# Patient Record
Sex: Male | Born: 1950 | Race: Black or African American | Hispanic: No | Smoking: Former smoker
Health system: Southern US, Community
[De-identification: ages and names within clinical notes are randomized; demographics above are authoritative.]

## PROBLEM LIST (undated history)

## (undated) DIAGNOSIS — Z860100 Personal history of colon polyps, unspecified: Secondary | ICD-10-CM

## (undated) DIAGNOSIS — E119 Type 2 diabetes mellitus without complications: Secondary | ICD-10-CM

## (undated) DIAGNOSIS — R3129 Other microscopic hematuria: Secondary | ICD-10-CM

## (undated) DIAGNOSIS — N400 Enlarged prostate without lower urinary tract symptoms: Secondary | ICD-10-CM

## (undated) HISTORY — DX: Personal history of colon polyps, unspecified: Z86.0100

## (undated) HISTORY — DX: Personal history of colonic polyps: Z86.010

## (undated) HISTORY — DX: Benign prostatic hyperplasia without lower urinary tract symptoms: N40.0

## (undated) HISTORY — PX: CHOLECYSTECTOMY: SHX55

## (undated) HISTORY — PX: COLONOSCOPY W/ POLYPECTOMY: SHX1380

## (undated) HISTORY — DX: Other microscopic hematuria: R31.29

## (undated) HISTORY — DX: Type 2 diabetes mellitus without complications: E11.9

---

## 2001-10-21 ENCOUNTER — Encounter: Payer: Self-pay | Admitting: Emergency Medicine

## 2001-10-21 ENCOUNTER — Emergency Department (HOSPITAL_COMMUNITY): Admission: EM | Admit: 2001-10-21 | Discharge: 2001-10-21 | Payer: Self-pay | Admitting: Emergency Medicine

## 2002-10-07 DIAGNOSIS — Z86010 Personal history of colonic polyps: Secondary | ICD-10-CM

## 2002-10-07 DIAGNOSIS — Z860101 Personal history of adenomatous and serrated colon polyps: Secondary | ICD-10-CM | POA: Insufficient documentation

## 2002-10-07 HISTORY — DX: Personal history of colonic polyps: Z86.010

## 2002-11-04 ENCOUNTER — Encounter: Payer: Self-pay | Admitting: Emergency Medicine

## 2002-11-04 ENCOUNTER — Emergency Department (HOSPITAL_COMMUNITY): Admission: EM | Admit: 2002-11-04 | Discharge: 2002-11-04 | Payer: Self-pay | Admitting: Emergency Medicine

## 2003-07-05 ENCOUNTER — Emergency Department (HOSPITAL_COMMUNITY): Admission: EM | Admit: 2003-07-05 | Discharge: 2003-07-05 | Payer: Self-pay | Admitting: Emergency Medicine

## 2003-07-09 ENCOUNTER — Encounter (INDEPENDENT_AMBULATORY_CARE_PROVIDER_SITE_OTHER): Payer: Self-pay | Admitting: Specialist

## 2003-07-09 ENCOUNTER — Encounter: Payer: Self-pay | Admitting: Surgery

## 2003-07-10 ENCOUNTER — Inpatient Hospital Stay (HOSPITAL_COMMUNITY): Admission: RE | Admit: 2003-07-10 | Discharge: 2003-07-11 | Payer: Self-pay | Admitting: Surgery

## 2004-10-17 ENCOUNTER — Ambulatory Visit: Payer: Self-pay | Admitting: Internal Medicine

## 2004-11-14 ENCOUNTER — Ambulatory Visit (HOSPITAL_COMMUNITY): Admission: RE | Admit: 2004-11-14 | Discharge: 2004-11-14 | Payer: Self-pay | Admitting: Internal Medicine

## 2005-03-06 ENCOUNTER — Ambulatory Visit: Payer: Self-pay | Admitting: Internal Medicine

## 2006-01-28 ENCOUNTER — Ambulatory Visit: Payer: Self-pay | Admitting: Internal Medicine

## 2006-04-02 ENCOUNTER — Ambulatory Visit: Payer: Self-pay | Admitting: Internal Medicine

## 2006-07-28 ENCOUNTER — Ambulatory Visit: Payer: Self-pay | Admitting: Internal Medicine

## 2007-01-27 ENCOUNTER — Ambulatory Visit: Payer: Self-pay | Admitting: Internal Medicine

## 2007-01-27 LAB — CONVERTED CEMR LAB
ALT: 21 units/L (ref 0–40)
AST: 18 units/L (ref 0–37)
Albumin: 3.8 g/dL (ref 3.5–5.2)
Alkaline Phosphatase: 47 units/L (ref 39–117)
BUN: 8 mg/dL (ref 6–23)
Bacteria, UA: NEGATIVE
Basophils Absolute: 0 10*3/uL (ref 0.0–0.1)
Basophils Relative: 0.8 % (ref 0.0–1.0)
Bilirubin Urine: NEGATIVE
Bilirubin, Direct: 0.1 mg/dL (ref 0.0–0.3)
CO2: 26 meq/L (ref 19–32)
Calcium: 9 mg/dL (ref 8.4–10.5)
Chloride: 105 meq/L (ref 96–112)
Cholesterol: 195 mg/dL (ref 0–200)
Creatinine, Ser: 0.8 mg/dL (ref 0.4–1.5)
Eosinophils Absolute: 0.2 10*3/uL (ref 0.0–0.6)
Eosinophils Relative: 3.9 % (ref 0.0–5.0)
Free T4: 0.5 ng/dL — ABNORMAL LOW (ref 0.6–1.6)
GFR calc Af Amer: 129 mL/min
GFR calc non Af Amer: 107 mL/min
Glucose, Bld: 119 mg/dL — ABNORMAL HIGH (ref 70–99)
HCT: 47.7 % (ref 39.0–52.0)
HDL: 49.3 mg/dL (ref >39.0)
Hemoglobin, Urine: NEGATIVE
Hemoglobin: 16.2 g/dL (ref 13.0–17.0)
Ketones, ur: NEGATIVE mg/dL
LDL Cholesterol: 126 mg/dL — ABNORMAL HIGH (ref 0–99)
Lymphocytes Relative: 32.5 % (ref 12.0–46.0)
MCHC: 34 g/dL (ref 30.0–36.0)
MCV: 93.6 fL (ref 78.0–100.0)
Monocytes Absolute: 0.5 10*3/uL (ref 0.2–0.7)
Monocytes Relative: 8.9 % (ref 3.0–11.0)
Mucus, UA: NEGATIVE
Neutro Abs: 3.4 10*3/uL (ref 1.4–7.7)
Neutrophils Relative %: 53.9 % (ref 43.0–77.0)
Nitrite: NEGATIVE
PSA: 1.02 ng/mL (ref 0.10–4.00)
Platelets: 202 10*3/uL (ref 150–400)
Potassium: 3.9 meq/L (ref 3.5–5.1)
RBC / HPF: NONE SEEN
RBC: 5.09 M/uL (ref 4.22–5.81)
RDW: 13.1 % (ref 11.5–14.6)
Sodium: 139 meq/L (ref 135–145)
Specific Gravity, Urine: 1.025 (ref 1.000–1.03)
TSH: 0.61 microintl units/mL (ref 0.35–5.50)
Total Bilirubin: 0.7 mg/dL (ref 0.3–1.2)
Total CHOL/HDL Ratio: 4
Total Protein, Urine: NEGATIVE mg/dL
Total Protein: 7.3 g/dL (ref 6.0–8.3)
Triglycerides: 97 mg/dL (ref 0–149)
Urine Glucose: NEGATIVE mg/dL
Urobilinogen, UA: 0.2 (ref 0.0–1.0)
VLDL: 19 mg/dL (ref 0–40)
WBC: 6.1 10*3/uL (ref 4.5–10.5)
pH: 6 (ref 5.0–8.0)

## 2007-02-03 ENCOUNTER — Ambulatory Visit: Payer: Self-pay | Admitting: Internal Medicine

## 2008-02-01 ENCOUNTER — Ambulatory Visit: Payer: Self-pay | Admitting: Internal Medicine

## 2008-02-01 DIAGNOSIS — R7309 Other abnormal glucose: Secondary | ICD-10-CM

## 2008-02-01 LAB — CONVERTED CEMR LAB
ALT: 29 units/L (ref 0–53)
AST: 21 units/L (ref 0–37)
Albumin: 4.1 g/dL (ref 3.5–5.2)
Alkaline Phosphatase: 47 units/L (ref 39–117)
BUN: 12 mg/dL (ref 6–23)
Basophils Absolute: 0 10*3/uL (ref 0.0–0.1)
Basophils Relative: 0.4 % (ref 0.0–1.0)
Bilirubin Urine: NEGATIVE
Bilirubin, Direct: 0.1 mg/dL (ref 0.0–0.3)
CO2: 26 meq/L (ref 19–32)
Calcium: 9.6 mg/dL (ref 8.4–10.5)
Chloride: 106 meq/L (ref 96–112)
Cholesterol: 181 mg/dL (ref 0–200)
Creatinine, Ser: 0.8 mg/dL (ref 0.4–1.5)
Crystals: NEGATIVE
Eosinophils Absolute: 0.2 10*3/uL (ref 0.0–0.6)
Eosinophils Relative: 3.5 % (ref 0.0–5.0)
GFR calc Af Amer: 129 mL/min
GFR calc non Af Amer: 106 mL/min
Glucose, Bld: 106 mg/dL — ABNORMAL HIGH (ref 70–99)
HCT: 46 % (ref 39.0–52.0)
HDL: 45.1 mg/dL (ref >39.0)
Hemoglobin, Urine: NEGATIVE
Hemoglobin: 15.2 g/dL (ref 13.0–17.0)
Hgb A1c MFr Bld: 6.4 % — ABNORMAL HIGH (ref 4.6–6.0)
Ketones, ur: NEGATIVE mg/dL
LDL Cholesterol: 121 mg/dL — ABNORMAL HIGH (ref 0–99)
Lymphocytes Relative: 35.9 % (ref 12.0–46.0)
MCHC: 32.9 g/dL (ref 30.0–36.0)
MCV: 96.4 fL (ref 78.0–100.0)
Monocytes Absolute: 0.5 10*3/uL (ref 0.2–0.7)
Monocytes Relative: 8.1 % (ref 3.0–11.0)
Mucus, UA: NEGATIVE
Neutro Abs: 3.5 10*3/uL (ref 1.4–7.7)
Neutrophils Relative %: 52.1 % (ref 43.0–77.0)
Nitrite: NEGATIVE
PSA: 0.71 ng/mL (ref 0.10–4.00)
Platelets: 232 10*3/uL (ref 150–400)
Potassium: 4.2 meq/L (ref 3.5–5.1)
RBC / HPF: NONE SEEN
RBC: 4.77 M/uL (ref 4.22–5.81)
RDW: 13 % (ref 11.5–14.6)
Sodium: 138 meq/L (ref 135–145)
Specific Gravity, Urine: >=1.03 (ref 1.000–1.03)
TSH: 0.51 microintl units/mL (ref 0.35–5.50)
Total Bilirubin: 0.8 mg/dL (ref 0.3–1.2)
Total CHOL/HDL Ratio: 4
Total Protein, Urine: NEGATIVE mg/dL
Total Protein: 7.4 g/dL (ref 6.0–8.3)
Triglycerides: 72 mg/dL (ref 0–149)
Urine Glucose: NEGATIVE mg/dL
Urobilinogen, UA: 0.2 (ref 0.0–1.0)
VLDL: 14 mg/dL (ref 0–40)
WBC: 6.5 10*3/uL (ref 4.5–10.5)
pH: 6 (ref 5.0–8.0)

## 2008-02-08 ENCOUNTER — Ambulatory Visit: Payer: Self-pay | Admitting: Internal Medicine

## 2008-02-08 DIAGNOSIS — E119 Type 2 diabetes mellitus without complications: Secondary | ICD-10-CM

## 2008-02-08 DIAGNOSIS — N400 Enlarged prostate without lower urinary tract symptoms: Secondary | ICD-10-CM

## 2008-02-08 DIAGNOSIS — J019 Acute sinusitis, unspecified: Secondary | ICD-10-CM

## 2008-06-15 ENCOUNTER — Ambulatory Visit: Payer: Self-pay | Admitting: Internal Medicine

## 2008-06-15 LAB — CONVERTED CEMR LAB
CO2: 30 meq/L (ref 19–32)
Calcium: 9.5 mg/dL (ref 8.4–10.5)
Creatinine, Ser: 0.9 mg/dL (ref 0.4–1.5)
GFR calc Af Amer: 112 mL/min
Glucose, Bld: 105 mg/dL — ABNORMAL HIGH (ref 70–99)
Sodium: 139 meq/L (ref 135–145)

## 2008-06-22 ENCOUNTER — Ambulatory Visit: Payer: Self-pay | Admitting: Internal Medicine

## 2008-12-29 ENCOUNTER — Ambulatory Visit: Payer: Self-pay | Admitting: Internal Medicine

## 2008-12-30 LAB — CONVERTED CEMR LAB
CO2: 28 meq/L (ref 19–32)
Calcium: 9.1 mg/dL (ref 8.4–10.5)
Creatinine, Ser: 0.9 mg/dL (ref 0.4–1.5)
GFR calc Af Amer: 112 mL/min
Glucose, Bld: 103 mg/dL — ABNORMAL HIGH (ref 70–99)
Sodium: 139 meq/L (ref 135–145)

## 2009-01-05 ENCOUNTER — Ambulatory Visit: Payer: Self-pay | Admitting: Internal Medicine

## 2009-06-29 ENCOUNTER — Ambulatory Visit: Payer: Self-pay | Admitting: Internal Medicine

## 2009-07-03 LAB — CONVERTED CEMR LAB
ALT: 17 units/L (ref 0–53)
AST: 16 units/L (ref 0–37)
BUN: 15 mg/dL (ref 6–23)
Basophils Relative: 0.5 % (ref 0.0–3.0)
CO2: 28 meq/L (ref 19–32)
Calcium: 8.8 mg/dL (ref 8.4–10.5)
Creatinine, Ser: 0.9 mg/dL (ref 0.4–1.5)
Eosinophils Relative: 3.8 % (ref 0.0–5.0)
GFR calc non Af Amer: 111.64 mL/min (ref >60)
Glucose, Bld: 95 mg/dL (ref 70–99)
HCT: 44.4 % (ref 39.0–52.0)
Hemoglobin: 15.3 g/dL (ref 13.0–17.0)
Ketones, ur: NEGATIVE mg/dL
Lymphs Abs: 2.1 10*3/uL (ref 0.7–4.0)
MCV: 94.1 fL (ref 78.0–100.0)
Monocytes Absolute: 0.7 10*3/uL (ref 0.1–1.0)
Monocytes Relative: 10.6 % (ref 3.0–12.0)
Neutro Abs: 3.2 10*3/uL (ref 1.4–7.7)
Platelets: 168 10*3/uL (ref 150.0–400.0)
RBC: 4.71 M/uL (ref 4.22–5.81)
Sodium: 140 meq/L (ref 135–145)
TSH: 0.44 microintl units/mL (ref 0.35–5.50)
Total Bilirubin: 1 mg/dL (ref 0.3–1.2)
Total CHOL/HDL Ratio: 4
Total Protein, Urine: NEGATIVE mg/dL
Total Protein: 7.2 g/dL (ref 6.0–8.3)
Urine Glucose: NEGATIVE mg/dL
WBC: 6.2 10*3/uL (ref 4.5–10.5)

## 2009-07-06 ENCOUNTER — Ambulatory Visit: Payer: Self-pay | Admitting: Internal Medicine

## 2009-07-06 DIAGNOSIS — N419 Inflammatory disease of prostate, unspecified: Secondary | ICD-10-CM

## 2009-09-04 ENCOUNTER — Encounter (INDEPENDENT_AMBULATORY_CARE_PROVIDER_SITE_OTHER): Payer: Self-pay | Admitting: *Deleted

## 2009-10-19 ENCOUNTER — Ambulatory Visit: Payer: Self-pay | Admitting: Internal Medicine

## 2009-11-02 ENCOUNTER — Ambulatory Visit: Payer: Self-pay | Admitting: Internal Medicine

## 2009-11-06 ENCOUNTER — Encounter: Payer: Self-pay | Admitting: Internal Medicine

## 2009-12-28 ENCOUNTER — Ambulatory Visit: Payer: Self-pay | Admitting: Internal Medicine

## 2009-12-28 LAB — CONVERTED CEMR LAB
BUN: 7 mg/dL (ref 6–23)
Bilirubin Urine: NEGATIVE
Creatinine, Ser: 1 mg/dL (ref 0.4–1.5)
GFR calc non Af Amer: 98.69 mL/min (ref >60)
Hemoglobin, Urine: NEGATIVE
Hgb A1c MFr Bld: 6.3 % (ref 4.6–6.5)
Ketones, ur: NEGATIVE mg/dL
Urobilinogen, UA: 0.2 (ref 0.0–1.0)

## 2010-01-04 ENCOUNTER — Ambulatory Visit: Payer: Self-pay | Admitting: Internal Medicine

## 2010-01-04 DIAGNOSIS — Z87891 Personal history of nicotine dependence: Secondary | ICD-10-CM

## 2010-07-05 ENCOUNTER — Ambulatory Visit: Payer: Self-pay | Admitting: Internal Medicine

## 2010-07-07 ENCOUNTER — Telehealth: Payer: Self-pay | Admitting: Internal Medicine

## 2010-07-12 ENCOUNTER — Ambulatory Visit: Payer: Self-pay | Admitting: Internal Medicine

## 2010-07-12 ENCOUNTER — Encounter: Payer: Self-pay | Admitting: Internal Medicine

## 2010-08-23 ENCOUNTER — Ambulatory Visit: Payer: Self-pay | Admitting: Internal Medicine

## 2010-08-28 ENCOUNTER — Encounter: Payer: Self-pay | Admitting: Internal Medicine

## 2010-08-28 LAB — CONVERTED CEMR LAB
Ketones, ur: NEGATIVE mg/dL
Specific Gravity, Urine: 1.02 (ref 1.000–1.030)
Urine Glucose: 250 mg/dL
pH: 6.5 (ref 5.0–8.0)

## 2011-01-04 ENCOUNTER — Other Ambulatory Visit: Payer: Self-pay | Admitting: Internal Medicine

## 2011-01-04 ENCOUNTER — Ambulatory Visit
Admission: RE | Admit: 2011-01-04 | Discharge: 2011-01-04 | Payer: Self-pay | Source: Home / Self Care | Attending: Internal Medicine | Admitting: Internal Medicine

## 2011-01-04 LAB — URINALYSIS, ROUTINE W REFLEX MICROSCOPIC
Bilirubin Urine: NEGATIVE
Hemoglobin, Urine: NEGATIVE
Ketones, ur: NEGATIVE
Nitrite: NEGATIVE
Specific Gravity, Urine: 1.02 (ref 1.000–1.030)
Total Protein, Urine: NEGATIVE
Urine Glucose: 100
Urobilinogen, UA: 0.2 (ref 0.0–1.0)
pH: 6 (ref 5.0–8.0)

## 2011-01-04 LAB — BASIC METABOLIC PANEL
BUN: 14 mg/dL (ref 6–23)
CO2: 27 mEq/L (ref 19–32)
Calcium: 9.1 mg/dL (ref 8.4–10.5)
Chloride: 106 mEq/L (ref 96–112)
Creatinine, Ser: 1 mg/dL (ref 0.4–1.5)
GFR: 103.08 mL/min (ref >60.00)
Glucose, Bld: 110 mg/dL — ABNORMAL HIGH (ref 70–99)
Potassium: 4.5 mEq/L (ref 3.5–5.1)
Sodium: 139 mEq/L (ref 135–145)

## 2011-01-04 LAB — HEMOGLOBIN A1C: Hgb A1c MFr Bld: 6.6 % — ABNORMAL HIGH (ref 4.6–6.5)

## 2011-01-10 ENCOUNTER — Ambulatory Visit
Admission: RE | Admit: 2011-01-10 | Discharge: 2011-01-10 | Payer: Self-pay | Source: Home / Self Care | Attending: Internal Medicine | Admitting: Internal Medicine

## 2011-01-13 LAB — CONVERTED CEMR LAB
ALT: 17 units/L (ref 0–53)
BUN: 18 mg/dL (ref 6–23)
CO2: 26 meq/L (ref 19–32)
Chloride: 107 meq/L (ref 96–112)
Cholesterol: 200 mg/dL (ref 0–200)
Eosinophils Relative: 3.1 % (ref 0.0–5.0)
Glucose, Bld: 99 mg/dL (ref 70–99)
HCT: 46.9 % (ref 39.0–52.0)
Hgb A1c MFr Bld: 6.2 % (ref 4.6–6.5)
Ketones, ur: NEGATIVE mg/dL
Lymphs Abs: 2.6 10*3/uL (ref 0.7–4.0)
MCHC: 34.1 g/dL (ref 30.0–36.0)
MCV: 95.5 fL (ref 78.0–100.0)
Monocytes Absolute: 0.6 10*3/uL (ref 0.1–1.0)
Nitrite: NEGATIVE
Platelets: 199 10*3/uL (ref 150.0–400.0)
Potassium: 4.4 meq/L (ref 3.5–5.1)
RDW: 14.2 % (ref 11.5–14.6)
Specific Gravity, Urine: >=1.03 (ref 1.000–1.030)
TSH: 0.49 microintl units/mL (ref 0.35–5.50)
Total Bilirubin: 0.9 mg/dL (ref 0.3–1.2)
VLDL: 12 mg/dL (ref 0.0–40.0)
WBC: 7.2 10*3/uL (ref 4.5–10.5)
pH: 5.5 (ref 5.0–8.0)

## 2011-01-15 NOTE — Miscellaneous (Signed)
  Clinical Lists Changes  Medications: Added new medication of CIPRO 500 MG TABS (CIPROFLOXACIN HCL) 1 two times a day - Signed Removed medication of CIPRO 500 MG TABS (CIPROFLOXACIN HCL) 1tab by mouth two times a day x 14 days Rx of CIPRO 500 MG TABS (CIPROFLOXACIN HCL) 1 two times a day;  #20 x 0;  Signed;  Entered by: Lanier Prude, CMA(AAMA);  Authorized by: Tresa Garter MD;  Method used: Electronically to [facility]. [phone]*, [address], [place], [place], [place]  [address], Ph: [phone], Fax: [phone]    Prescriptions: CIPRO 500 MG TABS (CIPROFLOXACIN HCL) 1 two times a day  #20 x 0   Entered by:   Lanier Prude, CMA(AAMA)   Authorized by:   Tresa Garter MD   Signed by:   Lanier Prude, [facility]) on 08/28/2010   Method used:   Electronically to        [facility]. [phone]* (retail)       [address]       [place], [place]  [address]       Ph: [phone]       Fax: [phone]   RxID:   [id number]

## 2011-01-15 NOTE — Assessment & Plan Note (Signed)
Summary: CPX /NWS  #   Vital Signs:  Patient profile:   60 year old male Height:      71 inches Weight:      205 pounds BMI:     28.70 O2 Sat:      98 % on Room air Temp:     98.2 degrees F oral Pulse rate:   64 / minute Pulse rhythm:   regular Resp:     16 per minute BP sitting:   130 / 90  (left arm) Cuff size:   regular  Vitals Entered By: Lanier Prude, CMA(AAMA) (July 12, 2010 8:30 AM)  O2 Flow:  Room air CC: CPX Is Patient Diabetic? Yes   CC:  CPX.  History of Present Illness: The patient presents for a wellness examination   Current Medications (verified): 1)  Aspirin 81 Mg  Tbec (Aspirin) .... One By Mouth Every Day 2)  Fish Oil   Oil (Fish Oil) .Marland Kitchen.. 1 By Mouth Bid 3)  Vitamin D 1000 Unit  Tabs (Cholecalciferol) .Marland Kitchen.. 1 By Mouth Qd 4)  Cipro 500 Mg Tabs (Ciprofloxacin Hcl) .Marland Kitchen.. 1tab By Mouth Two Times A Day X 14 Days  Allergies (verified): No Known Drug Allergies  Past History:  Past Medical History: Last updated: 06/22/2008 Colonic polyps, hx of due 2010 Benign prostatic hypertrophy, nodularity Dr Serena Colonel q 1 year H/o microsc. hematuria Diabetes mellitus, type II, diet controlled  Past Surgical History: Last updated: 02/08/2008 Cholecystectomy Colon polypectomy  Family History: Last updated: 02/08/2008 F MI 48  Social History: Last updated: 02/08/2008 Occupation: post office Married Former Smoker  Review of Systems  The patient denies anorexia, fever, weight loss, weight gain, vision loss, decreased hearing, hoarseness, chest pain, syncope, dyspnea on exertion, peripheral edema, prolonged cough, headaches, hemoptysis, abdominal pain, melena, hematochezia, severe indigestion/heartburn, hematuria, incontinence, genital sores, muscle weakness, suspicious skin lesions, transient blindness, difficulty walking, depression, unusual weight change, abnormal bleeding, enlarged lymph nodes, angioedema, and testicular masses.    Physical  Exam  General:  Well-developed,well-nourished,in no acute distress; alert,appropriate and cooperative throughout examination Head:  Normocephalic and atraumatic without obvious abnormalities. No apparent alopecia or balding. Eyes:  No corneal or conjunctival inflammation noted. EOMI. Perrla.. Ears:  External ear exam shows no significant lesions or deformities.  Otoscopic examination reveals clear canals, tympanic membranes are intact bilaterally without bulging, retraction, inflammation or discharge. Hearing is grossly normal bilaterally. Nose:  External nasal examination shows no deformity or inflammation. Nasal mucosa are pink and moist without lesions or exudates. Mouth:  Oral mucosa and oropharynx without lesions or exudates.  Teeth in good repair. Neck:  No deformities, masses, or tenderness noted. Chest Wall:  No deformities, masses, tenderness or gynecomastia noted. Lungs:  Normal respiratory effort, chest expands symmetrically. Lungs are clear to auscultation, no crackles or wheezes. Heart:  Normal rate and regular rhythm. S1 and S2 normal without gallop, murmur, click, rub or other extra sounds. Abdomen:  Bowel sounds positive,abdomen soft and non-tender without masses, organomegaly or hernias noted. Rectal:  No external abnormalities noted. Normal sphincter tone. No rectal masses or tenderness. Genitalia:  Testes bilaterally descended without nodularity, tenderness or masses. No scrotal masses or lesions. No penis lesions or urethral discharge. Prostate:  Prostate gland firm and smooth, no enlargement, nodularity, tenderness, mass, asymmetry or induration. Msk:  No deformity or scoliosis noted of thoracic or lumbar spine.   Pulses:  R and L carotid,radial,femoral,dorsalis pedis and posterior tibial pulses are full and equal bilaterally Extremities:  No clubbing,  cyanosis, edema, or deformity noted with normal full range of motion of all joints.   Neurologic:  No cranial nerve deficits  noted. Station and gait are normal. Plantar reflexes are down-going bilaterally. DTRs are symmetrical throughout. Sensory, motor and coordinative functions appear intact. Skin:  Intact without suspicious lesions or rashes Cervical Nodes:  No lymphadenopathy noted Psych:  Cognition and judgment appear intact. Alert and cooperative with normal attention span and concentration. No apparent delusions, illusions, hallucinations   Impression & Recommendations:  Problem # 1:  ROUTINE GENERAL MEDICAL EXAM@HEALTH  CARE FACL (ICD-V70.0) Assessment New Health and age related issues were discussed. Available screening tests and vaccinations were discussed as well. Healthy life style including good diet and execise was discussed.  The labs were reviewed with the patient.  Orders: EKG w/ Interpretation (93000) nl  Problem # 2:  PROSTATITIS, NONSPECIFIC (ICD-601.9) Assessment: New Cipro given See "Patient Instructions".   Problem # 3:  DIABETES MELLITUS, TYPE II (ICD-250.00) on diet Assessment: Unchanged  His updated medication list for this problem includes:    Aspirin 81 Mg Tbec (Aspirin) ..... One by mouth every day  Problem # 4:  HEMORRHOIDS (ICD-455.6) int. Assessment: Unchanged  Complete Medication List: 1)  Aspirin 81 Mg Tbec (Aspirin) .... One by mouth every day 2)  Fish Oil Oil (Fish oil) .Marland Kitchen.. 1 by mouth bid 3)  Vitamin D 1000 Unit Tabs (Cholecalciferol) .Marland Kitchen.. 1 by mouth qd 4)  Cipro 500 Mg Tabs (Ciprofloxacin hcl) .Marland Kitchen.. 1tab by mouth two times a day x 14 days  Patient Instructions: 1)  Urine-dip prior to visit, ICD-9: -in 6 wks 595.0 2)  Please schedule a follow-up appointment in 6 months. 3)  BMP prior to visit, ICD-9:  790.29 4)  Urine-dip prior to visit, ICD-9: 5)  HbgA1C prior to visit, ICD-9:

## 2011-01-15 NOTE — Progress Notes (Signed)
Summary: UA results  Phone Note Outgoing Call Call back at [facility] Phone [phone]   Caller: Dr Call placed by: Margaret Pyle, CMA,  July 07, 2010 9:52 AM Call placed to: Patient Reason for Call: Discuss lab or test results Summary of Call: Per request by Dr. Posey Rea pt to be called and notified of possible UTI or Prostatitis. Rx Cipro 500mg  1 by mouth two times a day x 14 days. Left message on home machine for pt to return my call. Initial call taken by: Margaret Pyle, CMA,  July 07, 2010 9:53 AM  Follow-up for Phone Call        pt informed  Follow-up by: Ami Bullins CMA,  July 09, 2010 8:30 AM    New/Updated Medications: CIPRO 500 MG TABS (CIPROFLOXACIN HCL) 1tab by mouth two times a day x 14 days Prescriptions: CIPRO 500 MG TABS (CIPROFLOXACIN HCL) 1tab by mouth two times a day x 14 days  #28 x 0   Entered by:   Ami Bullins CMA   Authorized by:   Tresa Garter MD   Signed by:   Bill Salinas CMA on 07/09/2010   Method used:   Faxed to ...       [facility] [address] [phone]* (retail)       [address] [place], [place]  [address]       Ph: [phone]       Fax: [phone]   RxID:   [id number]

## 2011-01-15 NOTE — Assessment & Plan Note (Signed)
Summary: 6 MO ROV /NWS $50   Vital Signs:  Patient profile:   60 year old male Weight:      204 pounds Temp:     97.2 degrees F oral Pulse rate:   60 / minute BP sitting:   110 / 84  (left arm)  Vitals Entered By: Tora Perches (January 04, 2010 8:12 AM) CC: f/u Is Patient Diabetic? No   CC:  f/u.  History of Present Illness: F/u elev glu  Preventive Screening-Counseling & Management  Alcohol-Tobacco     Smoking Status: quit  Allergies (verified): No Known Drug Allergies  Past History:  Past Medical History: Last updated: 06/22/2008 Colonic polyps, hx of due 2010 Benign prostatic hypertrophy, nodularity Dr Serena Colonel q 1 year H/o microsc. hematuria Diabetes mellitus, type II, diet controlled  Social History: Last updated: 02/08/2008 Occupation: post office Married Former Smoker  Review of Systems  The patient denies fever, weight loss, weight gain, and chest pain.    Physical Exam  General:  Well-developed,well-nourished,in no acute distress; alert,appropriate and cooperative throughout examination Mouth:  Oral mucosa and oropharynx without lesions or exudates.  Teeth in good repair. Lungs:  Normal respiratory effort, chest expands symmetrically. Lungs are clear to auscultation, no crackles or wheezes. Heart:  Normal rate and regular rhythm. S1 and S2 normal without gallop, murmur, click, rub or other extra sounds. Abdomen:  Bowel sounds positive,abdomen soft and non-tender without masses, organomegaly or hernias noted. Msk:  No deformity or scoliosis noted of thoracic or lumbar spine.   Neurologic:  No cranial nerve deficits noted. Station and gait are normal. Plantar reflexes are down-going bilaterally. DTRs are symmetrical throughout. Sensory, motor and coordinative functions appear intact.   Impression & Recommendations:  Problem # 1:  DIABETES MELLITUS, TYPE II (ICD-250.00) Assessment Unchanged  His updated medication list for this problem  includes:    Aspirin 81 Mg Tbec (Aspirin) ..... One by mouth every day  Complete Medication List: 1)  Aspirin 81 Mg Tbec (Aspirin) .... One by mouth every day 2)  Fish Oil Oil (Fish oil) .Marland Kitchen.. 1 by mouth bid 3)  Vitamin D 1000 Unit Tabs (Cholecalciferol) .Marland Kitchen.. 1 by mouth qd  Patient Instructions: 1)  Please schedule a follow-up appointment in 6 months well w/labs and A1c v70.0  250.00.

## 2011-01-17 NOTE — Assessment & Plan Note (Signed)
Summary: 6 MO ROV /NWS  #   Vital Signs:  Patient profile:   60 year old male Height:      71 inches Weight:      210 pounds BMI:     29.39 Temp:     97.9 degrees F oral Pulse rate:   68 / minute Pulse rhythm:   regular Resp:     16 per minute BP sitting:   140 / 90  (left arm) Cuff size:   regular  Vitals Entered By: Lanier Prude, CMA(AAMA) (January 10, 2011 7:52 AM) CC: 6 mo f/u  Is Patient Diabetic? No   CC:  6 mo f/u .  History of Present Illness: The patient presents for a follow up of elev BP, diabetes  Current Medications (verified): 1)  Aspirin 81 Mg  Tbec (Aspirin) .... One By Mouth Every Day 2)  Fish Oil   Oil (Fish Oil) .Marland Kitchen.. 1 By Mouth Bid 3)  Vitamin D 1000 Unit  Tabs (Cholecalciferol) .Marland Kitchen.. 1 By Mouth Qd  Allergies (verified): No Known Drug Allergies  Past History:  Past Medical History: Last updated: 06/22/2008 Colonic polyps, hx of due 2010 Benign prostatic hypertrophy, nodularity Dr Serena Colonel q 1 year H/o microsc. hematuria Diabetes mellitus, type II, diet controlled  Social History: Last updated: 02/08/2008 Occupation: post office Married Former Smoker  Review of Systems  The patient denies fever, chest pain, dyspnea on exertion, abdominal pain, and depression.    Physical Exam  General:  Well-developed,well-nourished,in no acute distress; alert,appropriate and cooperative throughout examination Head:  Normocephalic and atraumatic without obvious abnormalities. No apparent alopecia or balding. Nose:  External nasal examination shows no deformity or inflammation. Nasal mucosa are pink and moist without lesions or exudates. Mouth:  Oral mucosa and oropharynx without lesions or exudates.  Teeth in good repair. Neck:  No deformities, masses, or tenderness noted. Lungs:  Normal respiratory effort, chest expands symmetrically. Lungs are clear to auscultation, no crackles or wheezes. Heart:  Normal rate and regular rhythm. S1 and S2 normal  without gallop, murmur, click, rub or other extra sounds. Abdomen:  Bowel sounds positive,abdomen soft and non-tender without masses, organomegaly or hernias noted. Msk:  No deformity or scoliosis noted of thoracic or lumbar spine.   Neurologic:  No cranial nerve deficits noted. Station and gait are normal. Plantar reflexes are down-going bilaterally. DTRs are symmetrical throughout. Sensory, motor and coordinative functions appear intact. Skin:  Intact without suspicious lesions or rashes Psych:  Cognition and judgment appear intact. Alert and cooperative with normal attention span and concentration. No apparent delusions, illusions, hallucinations   Impression & Recommendations:  Problem # 1:  DIABETES MELLITUS, TYPE II (ICD-250.00) Assessment Comment Only He declines any meds His updated medication list for this problem includes:    Aspirin 81 Mg Tbec (Aspirin) ..... One by mouth every day  Problem # 2:  ELEVATED BLOOD PRESSURE (ICD-796.2) Assessment: New will watch  Complete Medication List: 1)  Aspirin 81 Mg Tbec (Aspirin) .... One by mouth every day 2)  Fish Oil Oil (Fish oil) .Marland Kitchen.. 1 by mouth bid 3)  Vitamin D 1000 Unit Tabs (Cholecalciferol) .Marland Kitchen.. 1 by mouth qd  Patient Instructions: 1)  Please schedule a follow-up appointment in 6 months well w/labs and A1c 250.00 2)  Nl BP<130/85.   Orders Added: 1)  Est. Patient Level III [16109]   Immunization History:  Influenza Immunization History:    Influenza:  historical (09/19/2010)   Immunization History:  Influenza Immunization History:  Influenza:  Historical (09/19/2010)

## 2011-05-03 NOTE — Assessment & Plan Note (Signed)
[facility]                           PRIMARY CARE OFFICE NOTE   NAME:CERTAINRece, Zechman                         MRN:          [id number]  DATE:02/05/2007                            DOB:          1951-04-13    HISTORY:  The patient is a 60 year old male who presents for wellness  examination.   PAST MEDICAL HISTORY/FAMILY HISTORY/SOCIAL HISTORY:  Per October 10, 2004 note.   CURRENT MEDICINES:  None.   ALLERGIES:  NONE.   REVIEW OF SYSTEMS:  No chest pain or shortness of breath.  No syncope.  No neurologic complaints.  The rest of the 18-point review of systems is  negative   PHYSICAL EXAMINATION:  VITAL SIGNS:  Blood pressure 120/76, pulse 92,  temperature 97.6, weight 206 pounds.  GENERAL:  He is in no acute distress.  HEENT:  With moist mucosa.  NECK:  Supple.  No thyromegaly or bruit.  LUNGS:  Clear.  HEART:  Regular S1 and S2.  ABDOMEN:  Soft, nontender, no organomegaly.  EXTREMITIES:  Lower extremities without edema.  NEUROLOGICAL:  He is alert, oriented, cooperative.  RECTAL:  Exam reveals normal prostate.  Stool guaiac negative.  No  masses.   LABORATORIES:  On January 27, 2007, CBC normal, glucose 119,  cholesterol 195, LDL 126, HDL 49, TSH normal, PSA 1.02.  EKG:  Normal.  Urinalysis:  Normal.   ASSESSMENT AND PLAN:  1. Normal welness. Healthy lifestyle discussed.  Repeat exam in 12      months.  Start baby aspirin daily.  Lose weight.  Exercise.      Mediterranean diet.  2. History of elevated prostate-specific antigen, has normalized,      normal exam of the prostate.  3. History of colon polyp.  He will be due next colonoscopy in 2010.  4. Elevated glucose.  We will repeat glucose and A1c in 6 months.  5. I will see him back in 1 year.  6. Diet/weight loss discussed.     Georgina Quint. Plotnikov, MD  Electronically Signed   AVP/MedQ  DD: 02/05/2007  DT: 02/05/2007  Job #: [id number]

## 2011-05-03 NOTE — Op Note (Signed)
NAME:  [name], [name]                            ACCOUNT NO.:  [id number]   MEDICAL RECORD NO.:  [id number]                   PATIENT TYPE:  OBV   LOCATION:  0101                                 FACILITY:  [facility]   PHYSICIAN:  Sandria Bales. Ezzard Standing, M.D.               DATE OF BIRTH:  Oct 03, 1951   DATE OF PROCEDURE:  07/09/2003  DATE OF DISCHARGE:                                 OPERATIVE REPORT   PREOPERATIVE DIAGNOSES:  1. Cholecystitis.  2. Cholelithiasis.   POSTOPERATIVE DIAGNOSES:  1. Chronic and subacute cholecystitis.  2. Cholelithiasis.   PROCEDURE:  Laparoscopic cholecystectomy with intraoperative cholangiogram.   SURGEON:  Sandria Bales. Ezzard Standing, MD   FIRST ASSISTANT:  Vikki Ports, M.D.   ANESTHESIA:  General endotracheal.   ESTIMATED BLOOD LOSS:  Maybe 75 mL.   DRAINS AND TUBES:  None.   INDICATIONS FOR PROCEDURE:  [name] is a 60 year old, black male, who  actually I had scheduled for a laparoscopic cholecystectomy in December  2003, but he cancelled this on his own from his own accord.  He then re-  presented two days ago with right upper quadrant pain which he had for 2-3  days and signs and symptoms of cholecystitis.  The patient now presents for  attempted laparoscopic cholecystectomy.  The indications and potential  complications explained to the patient, potential complications included but  not limited to bleeding, infection, bile duct injury, open surgery, bowel  injury.   DESCRIPTION OF PROCEDURE:  The patient presents to the operating room where  he was given 1 g of Ancef at the initiation of the procedure.  He had PAS  stockings in place.  His abdomen was shaved, prepped with Betadine solution,  and sterilely draped.   An infraumbilical incision was made with sharp dissection carried down to  the abdominal cavity.  A 12 mm Hasson trocar was inserted into the abdominal  cavity and secured with a 0 Vicryl suture.  A 0-degree laparoscope was then  inserted through the abdomen and abdominal exploration carried out.  The  right and left lobes of the liver unremarkable.  The stomach was  unremarkable.  The patient had no evidence of inguinal hernia.  Much of his  bowel was covered with omentum.  Three additional trocars were placed, a 10  mm Ethicon trocar in the subxiphoid location, a 5 mm Ethicon trocar in the  right mid subcostal, and a 5 mm trocar in the right lateral subcostal  location.  The gallbladder was noted to be both kind of subacutely to very  chronically inflamed.  It was very thick-walled, very tough with chronic  adhesions up to it.   These adhesions were taken down sharply and bluntly.  Because of actual  trouble grabbing the thick-walled gallbladder, I tried to decompress it with  a needle, but that way did not do any good.  I changed out  the right mid  subcostal port from the 5 to a 10 and used the claw grasping clamp on the  gallbladder.   Sharp dissection was carried down identifying cystic artery.  The triangle  of Calot was densely adhesed and scarred, but I was able to dissect this  with what I thought was a pretty good dissection, identified the cystic duct  which was about 2 cm in length.  I placed a taut catheter into the abdominal  cavity and using a 14 gauge Jelco.  I then placed a cut taut catheter to cut  the cystic duct and secured with an Endo Clip.  I used half-strength Hypaque  solution, about 10-12 mL which showed free flow of contrast down the cystic  duct, into the common bile duct, into the duodenum, and back up the hepatic  radicles.  There was no narrowing or filling defects within the common bile  duct, and this was felt to be a normal intraoperative cholangiogram.   I then irrigated out the abdominal cavity.  I removed the taut catheter,  triply endoclipped the cystic duct, and then sharply and bluntly dissected  the gallbladder from the gallbladder bed._________again, this was a very   thickened, chronically inflamed gallbladder, and therefore much of this  gallbladder was intrahepatic and which I had dissected out of the  gallbladder bed and got some oozing.  This was controlled with Bovie  electrocautery.  Then I put some Surgicel up over the gallbladder bed.  I  then irrigated out her abdomen with two liters of saline.  There was no  active bleeding, no bile leakage in the gallbladder bed or the triangle of  Calot at the end of the procedure.  The patient tolerated the procedure  well.  The gallbladder was placed in an EndoCatch bag and delivered through  the umbilicus and sent to pathology.  Each port was then visualized.  There  was no bleeding at any port site.  The umbilical port was closed with a 0  Vicryl suture.  The skin entry site was closed with a 5-0 Monocryl suture,  painted with tincture of Benzoin and Steri-Strips.   The patient tolerated the procedure well and was transported to the recovery  room in good condition.  The sponge and needle count were correct at the end  of the case.                                               Sandria Bales. Ezzard Standing, M.D.    DHN/MEDQ  D:  07/09/2003  T:  07/09/2003  Job:  [id number]

## 2011-07-04 ENCOUNTER — Other Ambulatory Visit: Payer: Self-pay | Admitting: Internal Medicine

## 2011-07-04 ENCOUNTER — Other Ambulatory Visit: Payer: Self-pay

## 2011-07-04 DIAGNOSIS — Z0000 Encounter for general adult medical examination without abnormal findings: Secondary | ICD-10-CM

## 2011-07-04 DIAGNOSIS — E119 Type 2 diabetes mellitus without complications: Secondary | ICD-10-CM

## 2011-07-18 ENCOUNTER — Other Ambulatory Visit (INDEPENDENT_AMBULATORY_CARE_PROVIDER_SITE_OTHER): Payer: Self-pay

## 2011-07-18 ENCOUNTER — Encounter: Payer: Self-pay | Admitting: Internal Medicine

## 2011-07-18 ENCOUNTER — Other Ambulatory Visit (INDEPENDENT_AMBULATORY_CARE_PROVIDER_SITE_OTHER): Payer: Self-pay | Admitting: Internal Medicine

## 2011-07-18 DIAGNOSIS — E119 Type 2 diabetes mellitus without complications: Secondary | ICD-10-CM

## 2011-07-18 DIAGNOSIS — Z0000 Encounter for general adult medical examination without abnormal findings: Secondary | ICD-10-CM

## 2011-07-18 LAB — BASIC METABOLIC PANEL
BUN: 12 mg/dL (ref 6–23)
Chloride: 105 mEq/L (ref 96–112)
GFR: 109.45 mL/min (ref >60.00)
Glucose, Bld: 110 mg/dL — ABNORMAL HIGH (ref 70–99)
Potassium: 4.2 mEq/L (ref 3.5–5.1)
Sodium: 139 mEq/L (ref 135–145)

## 2011-07-18 LAB — CBC WITH DIFFERENTIAL/PLATELET
Basophils Relative: 0.9 % (ref 0.0–3.0)
Eosinophils Absolute: 0.2 10*3/uL (ref 0.0–0.7)
Eosinophils Relative: 3.3 % (ref 0.0–5.0)
Hemoglobin: 16 g/dL (ref 13.0–17.0)
Lymphocytes Relative: 34.2 % (ref 12.0–46.0)
MCHC: 33.4 g/dL (ref 30.0–36.0)
MCV: 95.1 fl (ref 78.0–100.0)
Monocytes Absolute: 0.5 10*3/uL (ref 0.1–1.0)
Neutro Abs: 3.3 10*3/uL (ref 1.4–7.7)
Neutrophils Relative %: 53.1 % (ref 43.0–77.0)
RBC: 5.02 Mil/uL (ref 4.22–5.81)
WBC: 6.2 10*3/uL (ref 4.5–10.5)

## 2011-07-18 LAB — URINALYSIS, ROUTINE W REFLEX MICROSCOPIC
Nitrite: POSITIVE
Total Protein, Urine: NEGATIVE
Urine Glucose: 250
pH: 6 (ref 5.0–8.0)

## 2011-07-18 LAB — HEPATIC FUNCTION PANEL
ALT: 22 U/L (ref 0–53)
Total Bilirubin: 1.1 mg/dL (ref 0.3–1.2)
Total Protein: 7.3 g/dL (ref 6.0–8.3)

## 2011-07-18 LAB — LIPID PANEL
HDL: 55.5 mg/dL (ref >39.00)
LDL Cholesterol: 128 mg/dL — ABNORMAL HIGH (ref 0–99)
VLDL: 12.4 mg/dL (ref 0.0–40.0)

## 2011-07-25 ENCOUNTER — Ambulatory Visit (INDEPENDENT_AMBULATORY_CARE_PROVIDER_SITE_OTHER): Payer: Federal, State, Local not specified - PPO | Admitting: Internal Medicine

## 2011-07-25 ENCOUNTER — Encounter: Payer: Self-pay | Admitting: Internal Medicine

## 2011-07-25 VITALS — BP 128/86 | HR 80 | Temp 97.7°F | Resp 16 | Ht 71.0 in | Wt 208.0 lb

## 2011-07-25 DIAGNOSIS — N419 Inflammatory disease of prostate, unspecified: Secondary | ICD-10-CM

## 2011-07-25 DIAGNOSIS — E119 Type 2 diabetes mellitus without complications: Secondary | ICD-10-CM

## 2011-07-25 DIAGNOSIS — Z0000 Encounter for general adult medical examination without abnormal findings: Secondary | ICD-10-CM

## 2011-07-25 MED ORDER — CIPROFLOXACIN HCL 500 MG PO TABS: 500.0000 mg | ORAL_TABLET | Freq: Two times a day (BID) | ORAL | Status: AC

## 2011-07-25 NOTE — Assessment & Plan Note (Signed)
F/u w/Dr K. Cipro

## 2011-07-25 NOTE — Assessment & Plan Note (Signed)
  On diet  

## 2011-07-25 NOTE — Progress Notes (Signed)
  Subjective:    Patient ID: [name], male    DOB: January 12, 1951, 60 y.o.   MRN: [id number]  HPI The patient is here for a wellness exam. The patient has been doing well overall without major physical or psychological issues going on lately. The patient needs to address  chronic hypertension that has been well controlled with medicines; to address chronic  hyperlipidemia controlled with medicines as well; and to address type 2 chronic pre-diabetes, controlled with medical treatment and diet.    Review of Systems  Constitutional: Negative for appetite change, fatigue and unexpected weight change.  HENT: Negative for nosebleeds, congestion, sore throat, sneezing, trouble swallowing and neck pain.   Eyes: Negative for itching and visual disturbance.  Respiratory: Negative for cough.   Cardiovascular: Negative for chest pain, palpitations and leg swelling.  Gastrointestinal: Negative for nausea, diarrhea, blood in stool and abdominal distention.  Genitourinary: Negative for frequency and hematuria.  Musculoskeletal: Negative for back pain, joint swelling and gait problem.  Skin: Negative for rash.  Neurological: Negative for dizziness, tremors, speech difficulty and weakness.  Psychiatric/Behavioral: Negative for sleep disturbance, dysphoric mood and agitation. The patient is not nervous/anxious.        Wt Readings from Last 3 Encounters:  07/25/11 208 lb (94.348 kg)  01/10/11 210 lb (95.255 kg)  07/12/10 205 lb (92.987 kg)   BP Readings from Last 3 Encounters:  07/25/11 128/86  01/10/11 140/90  07/12/10 130/90     Objective:   Physical Exam  Constitutional: He is oriented to person, place, and time. He appears well-developed and well-nourished.  HENT:  Mouth/Throat: Oropharynx is clear and moist.  Eyes: Conjunctivae are normal. Pupils are equal, round, and reactive to light.  Neck: Normal range of motion. No JVD present. No thyromegaly present.  Cardiovascular: Normal rate,  regular rhythm, normal heart sounds and intact distal pulses.  Exam reveals no gallop and no friction rub.   No murmur heard. Pulmonary/Chest: Effort normal and breath sounds normal. No respiratory distress. He has no wheezes. He has no rales. He exhibits no tenderness.  Abdominal: Soft. Bowel sounds are normal. He exhibits no distension and no mass. There is no tenderness. There is no rebound and no guarding.  Musculoskeletal: Normal range of motion. He exhibits no edema and no tenderness.  Lymphadenopathy:    He has no cervical adenopathy.  Neurological: He is alert and oriented to person, place, and time. He has normal reflexes. No cranial nerve deficit. He exhibits normal muscle tone. Coordination normal.  Skin: Skin is warm and dry. No rash noted.  Psychiatric: He has a normal mood and affect. His behavior is normal. Judgment and thought content normal.    Lab Results  Component Value Date   WBC 6.2 07/18/2011   HGB 16.0 07/18/2011   HCT 47.8 07/18/2011   PLT 176.0 07/18/2011   CHOL 196 07/18/2011   TRIG 62.0 07/18/2011   HDL 55.50 07/18/2011   ALT 22 07/18/2011   AST 18 07/18/2011   NA 139 07/18/2011   K 4.2 07/18/2011   CL 105 07/18/2011   CREATININE 0.9 07/18/2011   BUN 12 07/18/2011   CO2 24 07/18/2011   TSH 0.58 07/18/2011   PSA 1.33 07/05/2010   HGBA1C 6.2 07/18/2011         Assessment & Plan:

## 2011-11-29 ENCOUNTER — Telehealth: Payer: Self-pay | Admitting: Internal Medicine

## 2011-11-29 NOTE — Telephone Encounter (Signed)
Received copies from [facility] 12.14.12 . Forwarded  2 pages to Dr. Posey Rea ,for review. sj

## 2012-01-16 ENCOUNTER — Other Ambulatory Visit (INDEPENDENT_AMBULATORY_CARE_PROVIDER_SITE_OTHER): Payer: Federal, State, Local not specified - PPO

## 2012-01-16 DIAGNOSIS — E119 Type 2 diabetes mellitus without complications: Secondary | ICD-10-CM

## 2012-01-16 DIAGNOSIS — N419 Inflammatory disease of prostate, unspecified: Secondary | ICD-10-CM

## 2012-01-16 LAB — URINALYSIS, ROUTINE W REFLEX MICROSCOPIC
Bilirubin Urine: NEGATIVE
Hgb urine dipstick: NEGATIVE
Nitrite: NEGATIVE
Total Protein, Urine: NEGATIVE
Urine Glucose: NEGATIVE
Urobilinogen, UA: 0.2 (ref 0.0–1.0)

## 2012-01-16 LAB — BASIC METABOLIC PANEL
BUN: 14 mg/dL (ref 6–23)
Calcium: 9.5 mg/dL (ref 8.4–10.5)
Creatinine, Ser: 0.9 mg/dL (ref 0.4–1.5)

## 2012-01-16 LAB — HEMOGLOBIN A1C: Hgb A1c MFr Bld: 6.3 % (ref 4.6–6.5)

## 2012-01-23 ENCOUNTER — Ambulatory Visit (INDEPENDENT_AMBULATORY_CARE_PROVIDER_SITE_OTHER): Payer: Federal, State, Local not specified - PPO | Admitting: Internal Medicine

## 2012-01-23 ENCOUNTER — Encounter: Payer: Self-pay | Admitting: Internal Medicine

## 2012-01-23 ENCOUNTER — Other Ambulatory Visit: Payer: Federal, State, Local not specified - PPO

## 2012-01-23 VITALS — BP 138/88 | HR 76 | Temp 97.1°F | Resp 16 | Wt 203.0 lb

## 2012-01-23 DIAGNOSIS — N390 Urinary tract infection, site not specified: Secondary | ICD-10-CM

## 2012-01-23 DIAGNOSIS — R030 Elevated blood-pressure reading, without diagnosis of hypertension: Secondary | ICD-10-CM

## 2012-01-23 DIAGNOSIS — E119 Type 2 diabetes mellitus without complications: Secondary | ICD-10-CM

## 2012-01-23 DIAGNOSIS — Z0000 Encounter for general adult medical examination without abnormal findings: Secondary | ICD-10-CM

## 2012-01-23 DIAGNOSIS — N400 Enlarged prostate without lower urinary tract symptoms: Secondary | ICD-10-CM

## 2012-01-23 DIAGNOSIS — N419 Inflammatory disease of prostate, unspecified: Secondary | ICD-10-CM

## 2012-01-23 MED ORDER — CIPROFLOXACIN HCL 500 MG PO TABS: 500.0000 mg | ORAL_TABLET | Freq: Two times a day (BID) | ORAL | Status: AC

## 2012-01-23 NOTE — Assessment & Plan Note (Signed)
Urine cx Cipro x 2 wks

## 2012-01-23 NOTE — Progress Notes (Signed)
  Subjective:    Patient ID: [name], male    DOB: Aug 13, 1951, 61 y.o.   MRN: [id number]  HPI The patient presents for a follow-up of  chronic pre hypertension, UTIs, type 2 pre diabetes controlled with diet  Wt Readings from Last 3 Encounters:  01/23/12 203 lb (92.08 kg)  07/25/11 208 lb (94.348 kg)  01/10/11 210 lb (95.255 kg)      Review of Systems  Constitutional: Negative for appetite change, fatigue and unexpected weight change.  HENT: Positive for sore throat (1 day). Negative for nosebleeds, congestion, sneezing, trouble swallowing and neck pain.   Eyes: Negative for itching and visual disturbance.  Respiratory: Negative for cough.   Cardiovascular: Negative for chest pain, palpitations and leg swelling.  Gastrointestinal: Negative for nausea, diarrhea, blood in stool and abdominal distention.  Genitourinary: Negative for frequency and hematuria.  Musculoskeletal: Negative for back pain, joint swelling and gait problem.  Skin: Negative for rash.  Neurological: Negative for dizziness, tremors, speech difficulty and weakness.  Psychiatric/Behavioral: Negative for sleep disturbance, dysphoric mood and agitation. The patient is not nervous/anxious.        Objective:   Physical Exam  Constitutional: He is oriented to person, place, and time. He appears well-developed.  HENT:  Mouth/Throat: Oropharynx is clear and moist.       eryth throat  Eyes: Conjunctivae are normal. Pupils are equal, round, and reactive to light.  Neck: Normal range of motion. No JVD present. No thyromegaly present.  Cardiovascular: Normal rate, regular rhythm, normal heart sounds and intact distal pulses.  Exam reveals no gallop and no friction rub.   No murmur heard. Pulmonary/Chest: Effort normal and breath sounds normal. No respiratory distress. He has no wheezes. He has no rales. He exhibits no tenderness.  Abdominal: Soft. Bowel sounds are normal. He exhibits no distension and no mass. There  is no tenderness. There is no rebound and no guarding.  Musculoskeletal: Normal range of motion. He exhibits no edema and no tenderness.  Lymphadenopathy:    He has no cervical adenopathy.  Neurological: He is alert and oriented to person, place, and time. He has normal reflexes. No cranial nerve deficit. He exhibits normal muscle tone. Coordination normal.  Skin: Skin is warm and dry. No rash noted.  Psychiatric: He has a normal mood and affect. His behavior is normal. Judgment and thought content normal.     Lab Results  Component Value Date   WBC 6.2 07/18/2011   HGB 16.0 07/18/2011   HCT 47.8 07/18/2011   PLT 176.0 07/18/2011   GLUCOSE 106* 01/16/2012   CHOL 196 07/18/2011   TRIG 62.0 07/18/2011   HDL 55.50 07/18/2011   LDLCALC 128* 07/18/2011   ALT 22 07/18/2011   AST 18 07/18/2011   NA 140 01/16/2012   K 4.4 01/16/2012   CL 104 01/16/2012   CREATININE 0.9 01/16/2012   BUN 14 01/16/2012   CO2 29 01/16/2012   TSH 0.58 07/18/2011   PSA 1.33 07/05/2010   HGBA1C 6.3 01/16/2012   Abn UA    Assessment & Plan:

## 2012-01-23 NOTE — Assessment & Plan Note (Signed)
Nl BP at home 

## 2012-01-23 NOTE — Assessment & Plan Note (Signed)
Not on Rx 

## 2012-01-23 NOTE — Assessment & Plan Note (Signed)
  On diet  

## 2012-01-23 NOTE — Assessment & Plan Note (Addendum)
Resolved sx's  Abn UA 2/13  Get urine cx

## 2012-01-25 LAB — CULTURE, URINE COMPREHENSIVE: Colony Count: >=100000

## 2012-02-03 ENCOUNTER — Telehealth: Payer: Self-pay | Admitting: Internal Medicine

## 2012-02-03 DIAGNOSIS — N390 Urinary tract infection, site not specified: Secondary | ICD-10-CM

## 2012-02-03 NOTE — Telephone Encounter (Signed)
[name], please, ask the patient to come in to the lab to repeat UA and urine cx Thx

## 2012-02-06 ENCOUNTER — Other Ambulatory Visit (INDEPENDENT_AMBULATORY_CARE_PROVIDER_SITE_OTHER): Payer: Federal, State, Local not specified - PPO

## 2012-02-06 DIAGNOSIS — N390 Urinary tract infection, site not specified: Secondary | ICD-10-CM

## 2012-02-06 LAB — URINALYSIS, ROUTINE W REFLEX MICROSCOPIC
Bilirubin Urine: NEGATIVE
Ketones, ur: NEGATIVE
Total Protein, Urine: NEGATIVE
Urine Glucose: 100
pH: 6 (ref 5.0–8.0)

## 2012-02-06 NOTE — Telephone Encounter (Signed)
Pt notified and future lab orders placed.

## 2012-02-08 LAB — URINE CULTURE: Colony Count: NO GROWTH

## 2012-07-16 ENCOUNTER — Other Ambulatory Visit (INDEPENDENT_AMBULATORY_CARE_PROVIDER_SITE_OTHER): Payer: Federal, State, Local not specified - PPO

## 2012-07-16 DIAGNOSIS — R030 Elevated blood-pressure reading, without diagnosis of hypertension: Secondary | ICD-10-CM

## 2012-07-16 DIAGNOSIS — N400 Enlarged prostate without lower urinary tract symptoms: Secondary | ICD-10-CM

## 2012-07-16 DIAGNOSIS — Z0000 Encounter for general adult medical examination without abnormal findings: Secondary | ICD-10-CM

## 2012-07-16 DIAGNOSIS — E119 Type 2 diabetes mellitus without complications: Secondary | ICD-10-CM

## 2012-07-16 DIAGNOSIS — N390 Urinary tract infection, site not specified: Secondary | ICD-10-CM

## 2012-07-16 DIAGNOSIS — N419 Inflammatory disease of prostate, unspecified: Secondary | ICD-10-CM

## 2012-07-16 LAB — BASIC METABOLIC PANEL
BUN: 9 mg/dL (ref 6–23)
Calcium: 9.1 mg/dL (ref 8.4–10.5)
Creatinine, Ser: 0.8 mg/dL (ref 0.4–1.5)
GFR: 134.28 mL/min (ref >60.00)
Glucose, Bld: 107 mg/dL — ABNORMAL HIGH (ref 70–99)
Potassium: 4.9 mEq/L (ref 3.5–5.1)

## 2012-07-16 LAB — LIPID PANEL
Cholesterol: 170 mg/dL (ref 0–200)
LDL Cholesterol: 101 mg/dL — ABNORMAL HIGH (ref 0–99)
Triglycerides: 61 mg/dL (ref 0.0–149.0)
VLDL: 12.2 mg/dL (ref 0.0–40.0)

## 2012-07-16 LAB — CBC WITH DIFFERENTIAL/PLATELET
Basophils Relative: 0.5 % (ref 0.0–3.0)
Eosinophils Absolute: 0.3 10*3/uL (ref 0.0–0.7)
Eosinophils Relative: 4.1 % (ref 0.0–5.0)
HCT: 48.5 % (ref 39.0–52.0)
Lymphs Abs: 1.9 10*3/uL (ref 0.7–4.0)
MCHC: 33 g/dL (ref 30.0–36.0)
MCV: 96.9 fl (ref 78.0–100.0)
Monocytes Absolute: 0.6 10*3/uL (ref 0.1–1.0)
Neutrophils Relative %: 58.2 % (ref 43.0–77.0)
RBC: 5.01 Mil/uL (ref 4.22–5.81)

## 2012-07-16 LAB — HEPATIC FUNCTION PANEL
ALT: 23 U/L (ref 0–53)
Albumin: 4 g/dL (ref 3.5–5.2)
Total Bilirubin: 0.6 mg/dL (ref 0.3–1.2)

## 2012-07-16 LAB — TSH: TSH: 1 u[IU]/mL (ref 0.35–5.50)

## 2012-07-16 LAB — URINALYSIS, ROUTINE W REFLEX MICROSCOPIC
Bilirubin Urine: NEGATIVE
Hgb urine dipstick: NEGATIVE
Ketones, ur: NEGATIVE
Urobilinogen, UA: 0.2 (ref 0.0–1.0)

## 2012-07-16 LAB — PSA: PSA: 1.5 ng/mL (ref 0.10–4.00)

## 2012-07-23 ENCOUNTER — Encounter: Payer: Self-pay | Admitting: Internal Medicine

## 2012-07-23 ENCOUNTER — Ambulatory Visit (INDEPENDENT_AMBULATORY_CARE_PROVIDER_SITE_OTHER): Payer: Federal, State, Local not specified - PPO | Admitting: Internal Medicine

## 2012-07-23 VITALS — BP 110/80 | HR 80 | Temp 96.8°F | Resp 16 | Wt 201.0 lb

## 2012-07-23 DIAGNOSIS — N419 Inflammatory disease of prostate, unspecified: Secondary | ICD-10-CM

## 2012-07-23 DIAGNOSIS — Z0000 Encounter for general adult medical examination without abnormal findings: Secondary | ICD-10-CM

## 2012-07-23 DIAGNOSIS — N390 Urinary tract infection, site not specified: Secondary | ICD-10-CM

## 2012-07-23 DIAGNOSIS — R030 Elevated blood-pressure reading, without diagnosis of hypertension: Secondary | ICD-10-CM

## 2012-07-23 DIAGNOSIS — E119 Type 2 diabetes mellitus without complications: Secondary | ICD-10-CM

## 2012-07-23 NOTE — Assessment & Plan Note (Signed)
Recurrent. He had an abd [place] in 2011 with Dr Kirtland Bouchard.; Dr Retta Diones

## 2012-07-23 NOTE — Patient Instructions (Addendum)
Zostavax

## 2012-07-23 NOTE — Assessment & Plan Note (Signed)
We discussed age appropriate health related issues, including available/recomended screening tests and vaccinations. We discussed a need for adhering to healthy diet and exercise. Labs/EKG were reviewed/ordered. All questions were answered. Zostavax suggested   

## 2012-07-23 NOTE — Assessment & Plan Note (Signed)
  On diet  

## 2012-07-23 NOTE — Progress Notes (Signed)
Patient ID: [name], male   DOB: 1951-01-14, 61 y.o.   MRN: [id number]  Subjective:    Patient ID: [name], male    DOB: August 06, 1951, 61 y.o.   MRN: [id number]  HPI The patient presents for a follow-up of  chronic pre hypertension, UTIs, type 2 pre diabetes controlled with diet The patient is here for a wellness exam. The patient has been doing well overall without major physical or psychological issues going on lately.  Wt Readings from Last 3 Encounters:  07/23/12 201 lb (91.173 kg)  01/23/12 203 lb (92.08 kg)  07/25/11 208 lb (94.348 kg)   BP Readings from Last 3 Encounters:  07/23/12 110/80  01/23/12 138/88  07/25/11 128/86      Review of Systems  Constitutional: Negative for appetite change, fatigue and unexpected weight change.  HENT: Positive for sore throat (1 day). Negative for nosebleeds, congestion, sneezing, trouble swallowing and neck pain.   Eyes: Negative for itching and visual disturbance.  Respiratory: Negative for cough.   Cardiovascular: Negative for chest pain, palpitations and leg swelling.  Gastrointestinal: Negative for nausea, diarrhea, blood in stool and abdominal distention.  Genitourinary: Negative for frequency and hematuria.  Musculoskeletal: Negative for back pain, joint swelling and gait problem.  Skin: Negative for rash.  Neurological: Negative for dizziness, tremors, speech difficulty and weakness.  Psychiatric/Behavioral: Negative for disturbed wake/sleep cycle, dysphoric mood and agitation. The patient is not nervous/anxious.        Objective:   Physical Exam  Constitutional: He is oriented to person, place, and time. He appears well-developed.  HENT:  Mouth/Throat: Oropharynx is clear and moist.       eryth throat  Eyes: Conjunctivae are normal. Pupils are equal, round, and reactive to light.  Neck: Normal range of motion. No JVD present. No thyromegaly present.  Cardiovascular: Normal rate, regular rhythm, normal heart sounds  and intact distal pulses.  Exam reveals no gallop and no friction rub.   No murmur heard. Pulmonary/Chest: Effort normal and breath sounds normal. No respiratory distress. He has no wheezes. He has no rales. He exhibits no tenderness.  Abdominal: Soft. Bowel sounds are normal. He exhibits no distension and no mass. There is no tenderness. There is no rebound and no guarding.  Musculoskeletal: Normal range of motion. He exhibits no edema and no tenderness.  Lymphadenopathy:    He has no cervical adenopathy.  Neurological: He is alert and oriented to person, place, and time. He has normal reflexes. No cranial nerve deficit. He exhibits normal muscle tone. Coordination normal.  Skin: Skin is warm and dry. No rash noted.  Psychiatric: He has a normal mood and affect. His behavior is normal. Judgment and thought content normal.  He had rectal in Feb w/Dr D. - declined today   Lab Results  Component Value Date   WBC 6.6 07/16/2012   HGB 16.0 07/16/2012   HCT 48.5 07/16/2012   PLT 176.0 07/16/2012   GLUCOSE 107* 07/16/2012   CHOL 170 07/16/2012   TRIG 61.0 07/16/2012   HDL 57.30 07/16/2012   LDLCALC 101* 07/16/2012   ALT 23 07/16/2012   AST 37 07/16/2012   NA 138 07/16/2012   K 4.9 07/16/2012   CL 103 07/16/2012   CREATININE 0.8 07/16/2012   BUN 9 07/16/2012   CO2 29 07/16/2012   TSH 1.00 07/16/2012   PSA 1.50 07/16/2012   HGBA1C 6.0 07/16/2012   Abn UA    Assessment & Plan:

## 2012-07-23 NOTE — Assessment & Plan Note (Signed)
F/u w/Dr Retta Diones is pending

## 2012-07-23 NOTE — Assessment & Plan Note (Signed)
BP Readings from Last 3 Encounters:  07/23/12 110/80  01/23/12 138/88  07/25/11 128/86

## 2012-09-18 ENCOUNTER — Ambulatory Visit (INDEPENDENT_AMBULATORY_CARE_PROVIDER_SITE_OTHER): Payer: Federal, State, Local not specified - PPO

## 2012-09-18 DIAGNOSIS — Z23 Encounter for immunization: Secondary | ICD-10-CM

## 2012-12-18 ENCOUNTER — Other Ambulatory Visit (INDEPENDENT_AMBULATORY_CARE_PROVIDER_SITE_OTHER): Payer: Federal, State, Local not specified - PPO

## 2012-12-18 DIAGNOSIS — R030 Elevated blood-pressure reading, without diagnosis of hypertension: Secondary | ICD-10-CM

## 2012-12-18 DIAGNOSIS — N390 Urinary tract infection, site not specified: Secondary | ICD-10-CM

## 2012-12-18 DIAGNOSIS — Z0000 Encounter for general adult medical examination without abnormal findings: Secondary | ICD-10-CM

## 2012-12-18 DIAGNOSIS — E119 Type 2 diabetes mellitus without complications: Secondary | ICD-10-CM

## 2012-12-18 LAB — URINALYSIS, ROUTINE W REFLEX MICROSCOPIC
Bilirubin Urine: NEGATIVE
Ketones, ur: NEGATIVE
Nitrite: POSITIVE
Specific Gravity, Urine: 1.02 (ref 1.000–1.030)
Total Protein, Urine: NEGATIVE
pH: 6 (ref 5.0–8.0)

## 2012-12-18 LAB — BASIC METABOLIC PANEL
BUN: 11 mg/dL (ref 6–23)
CO2: 29 mEq/L (ref 19–32)
Chloride: 104 mEq/L (ref 96–112)
Creatinine, Ser: 1 mg/dL (ref 0.4–1.5)
Potassium: 4.2 mEq/L (ref 3.5–5.1)

## 2012-12-25 ENCOUNTER — Ambulatory Visit (INDEPENDENT_AMBULATORY_CARE_PROVIDER_SITE_OTHER): Payer: Federal, State, Local not specified - PPO | Admitting: Internal Medicine

## 2012-12-25 ENCOUNTER — Encounter: Payer: Self-pay | Admitting: Internal Medicine

## 2012-12-25 VITALS — BP 150/72 | HR 76 | Temp 98.4°F | Resp 16 | Wt 206.0 lb

## 2012-12-25 DIAGNOSIS — J019 Acute sinusitis, unspecified: Secondary | ICD-10-CM

## 2012-12-25 DIAGNOSIS — N419 Inflammatory disease of prostate, unspecified: Secondary | ICD-10-CM

## 2012-12-25 DIAGNOSIS — R030 Elevated blood-pressure reading, without diagnosis of hypertension: Secondary | ICD-10-CM | POA: Insufficient documentation

## 2012-12-25 DIAGNOSIS — E119 Type 2 diabetes mellitus without complications: Secondary | ICD-10-CM

## 2012-12-25 MED ORDER — PROMETHAZINE-CODEINE 6.25-10 MG/5ML PO SYRP: 5.0000 mL | ORAL_SOLUTION | ORAL | Status: DC | PRN

## 2012-12-25 MED ORDER — METFORMIN HCL 500 MG PO TABS: 500.0000 mg | ORAL_TABLET | Freq: Every day | ORAL | Status: DC

## 2012-12-25 MED ORDER — AMOXICILLIN-POT CLAVULANATE 875-125 MG PO TABS: 1.0000 | ORAL_TABLET | Freq: Two times a day (BID) | ORAL | Status: DC

## 2012-12-25 NOTE — Assessment & Plan Note (Signed)
augmentin

## 2012-12-25 NOTE — Assessment & Plan Note (Signed)
NAS diet Watch BP, loose wt

## 2012-12-25 NOTE — Progress Notes (Signed)
   Subjective:    HPI The patient presents for a follow-up of  chronic pre hypertension, UTIs, type 2 pre diabetes controlled with diet  Wt Readings from Last 3 Encounters:  12/25/12 206 lb (93.441 kg)  07/23/12 201 lb (91.173 kg)  01/23/12 203 lb (92.08 kg)   BP Readings from Last 3 Encounters:  12/25/12 150/72  07/23/12 110/80  01/23/12 138/88      Review of Systems  Constitutional: Negative for appetite change, fatigue and unexpected weight change.  HENT: Positive for sore throat (1 day). Negative for nosebleeds, congestion, sneezing, trouble swallowing and neck pain.   Eyes: Negative for itching and visual disturbance.  Respiratory: Negative for cough.   Cardiovascular: Negative for chest pain, palpitations and leg swelling.  Gastrointestinal: Negative for nausea, diarrhea, blood in stool and abdominal distention.  Genitourinary: Negative for frequency and hematuria.  Musculoskeletal: Negative for back pain, joint swelling and gait problem.  Skin: Negative for rash.  Neurological: Negative for dizziness, tremors, speech difficulty and weakness.  Psychiatric/Behavioral: Negative for sleep disturbance, dysphoric mood and agitation. The patient is not nervous/anxious.        Objective:   Physical Exam  Constitutional: He is oriented to person, place, and time. He appears well-developed.  HENT:  Mouth/Throat: Oropharynx is clear and moist.       eryth throat  Eyes: Conjunctivae normal are normal. Pupils are equal, round, and reactive to light.  Neck: Normal range of motion. No JVD present. No thyromegaly present.  Cardiovascular: Normal rate, regular rhythm, normal heart sounds and intact distal pulses.  Exam reveals no gallop and no friction rub.   No murmur heard. Pulmonary/Chest: Effort normal and breath sounds normal. No respiratory distress. He has no wheezes. He has no rales. He exhibits no tenderness.  Abdominal: Soft. Bowel sounds are normal. He exhibits no  distension and no mass. There is no tenderness. There is no rebound and no guarding.  Musculoskeletal: Normal range of motion. He exhibits no edema and no tenderness.  Lymphadenopathy:    He has no cervical adenopathy.  Neurological: He is alert and oriented to person, place, and time. He has normal reflexes. No cranial nerve deficit. He exhibits normal muscle tone. Coordination normal.  Skin: Skin is warm and dry. No rash noted.  Psychiatric: He has a normal mood and affect. His behavior is normal. Judgment and thought content normal.     Lab Results  Component Value Date   WBC 6.6 07/16/2012   HGB 16.0 07/16/2012   HCT 48.5 07/16/2012   PLT 176.0 07/16/2012   GLUCOSE 154* 12/18/2012   CHOL 170 07/16/2012   TRIG 61.0 07/16/2012   HDL 57.30 07/16/2012   LDLCALC 101* 07/16/2012   ALT 23 07/16/2012   AST 37 07/16/2012   NA 139 12/18/2012   K 4.2 12/18/2012   CL 104 12/18/2012   CREATININE 1.0 12/18/2012   BUN 11 12/18/2012   CO2 29 12/18/2012   TSH 1.00 07/16/2012   PSA 1.50 07/16/2012   HGBA1C 7.1* 12/18/2012   Abn UA    Assessment & Plan:

## 2012-12-25 NOTE — Assessment & Plan Note (Signed)
Augmentin x 10 d 

## 2012-12-25 NOTE — Assessment & Plan Note (Addendum)
Start Metformin

## 2013-01-21 ENCOUNTER — Telehealth: Payer: Self-pay | Admitting: Internal Medicine

## 2013-01-21 NOTE — Telephone Encounter (Signed)
Patient Information:  Caller Name: Jesiel  Phone: [phone]  Patient: [name], [name]  Gender: Male  DOB: Aug 25, 1951  Age: 62 Years  PCP: Plotnikov, Alex (Adults only)  Office Follow Up:  Does the office need to follow up with this patient?: Yes  Instructions For The Office: Patient declines appt. due to work schedule. Patient is requesting a Rx for antibiotic to be called into [facility] on [address] at [phone]. Patient can be reached by cell phone at [phone]-patient gives verbal authorization to leave voicemail message on cell phone.  RN Note:  Patient states he was seen in office 12/25/12 for sore throat and treated with antibiotic. Patient states sx resolved with antibiotic treatment. Patient states he again developed sore throat 12/20/12. Care advice given per guidelines. Call back parameters reviewed. Patient verbalizes understanding. Patient declines appt. due to work schedule. Patient is requesting a Rx for antibiotic to be called into [facility] on [address] at [phone]. Patient can be reached by cell phone at [phone]-patient gives verbal authorization to leave voicemail message on cell phone.  Symptoms  Reason For Call & Symptoms: Sore Throat  Reviewed Health History In EMR: Yes  Reviewed Medications In EMR: Yes  Reviewed Allergies In EMR: Yes  Reviewed Surgeries / Procedures: Yes  Date of Onset of Symptoms: 01/20/2013  Guideline(s) Used:  Sore Throat  Disposition Per Guideline:   Home Care  Reason For Disposition Reached:   Sore throat  Advice Given:  Liquids:  Adequate liquid intake is important to prevent dehydration. Drink 6-8 glasses of water per day.  Soft Diet:   Cold drinks and milk shakes are especially good (Reason: swollen tonsils can make some foods hard to swallow).  Pain Medicines:  For pain relief, you can take either acetaminophen, ibuprofen, or naproxen.  Acetaminophen (e.g.,  Tylenol):  Regular Strength Tylenol: Take 650 mg (two 325 mg pills) by mouth every 4-6 hours as needed. Each Regular Strength Tylenol pill has 325 mg of acetaminophen.  Call Back If:  Sore throat is the main symptom and it lasts longer than 24 hours  You become worse.  Patient Refused Recommendation:  Patient Requests Prescription  Patient declines appt. due to work schedule. Patient is requesting a Rx for antibiotic to be called into [facility] on [address] at [phone]. Patient can be reached by cell phone at [phone]-patient gives verbal authorization to leave voicemail message on cell phone.

## 2013-01-22 ENCOUNTER — Encounter: Payer: Self-pay | Admitting: Internal Medicine

## 2013-01-22 ENCOUNTER — Encounter: Payer: Self-pay | Admitting: *Deleted

## 2013-01-22 ENCOUNTER — Ambulatory Visit (INDEPENDENT_AMBULATORY_CARE_PROVIDER_SITE_OTHER): Payer: Federal, State, Local not specified - PPO | Admitting: Internal Medicine

## 2013-01-22 VITALS — BP 132/82 | HR 78 | Temp 97.8°F | Ht 71.0 in | Wt 204.2 lb

## 2013-01-22 DIAGNOSIS — J209 Acute bronchitis, unspecified: Secondary | ICD-10-CM

## 2013-01-22 MED ORDER — AZITHROMYCIN 250 MG PO TABS: ORAL_TABLET | ORAL | Status: DC

## 2013-01-22 NOTE — Telephone Encounter (Signed)
Pt was seen today by Nicki Reaper, NP-C.

## 2013-01-22 NOTE — Progress Notes (Signed)
HPI  Pt presents to the clinic with 3 days history of sore throat, cough and sputum production. He has been taking OTC mucinex but the sputum seems to be getting thicker and greener. He was seen about a month ago by Dr. Posey Rea for sinusitis. He was given Augmentin but he accidentally threw the antibiotics away so he never finished the course of antibiotics. He seemed to be getting better but over the last 3 days has gotten worse. He has no history of allergies or asthma but he has had sick contacts.  Review of Systems    Past Medical History  Diagnosis Date  . Hx of colonic polyp   . BPH (benign prostatic hypertrophy)     nodularity- Dr. Serena Colonel q 1 year  . Microscopic hematuria     hx of  . Diabetes mellitus type II     Family History  Problem Relation Age of Onset  . Heart disease Father 64  . Hypertension Mother   . Kidney disease Mother     ESRD    History   Social History  . Marital Status: Legally Separated    Spouse Name: N/A    Number of Children: N/A  . Years of Education: N/A   Occupational History  . Not on file.   Social History Main Topics  . Smoking status: Former Smoker    Quit date: 08/28/2002  . Smokeless tobacco: Not on file  . Alcohol Use: No  . Drug Use: No  . Sexually Active: Yes   Other Topics Concern  . Not on file   Social History Narrative  . No narrative on file    No Known Allergies   Constitutional: Positive fatigue. Denies headache, fever or abrupt weight changes.  HEENT:  Positive sore throat. Denies eye redness, ear pain, ringing in the ears, wax buildup, runny nose or bloody nose. Respiratory: Positive cough and thick green sputum production. Denies difficulty breathing or shortness of breath.  Cardiovascular: Denies chest pain, chest tightness, palpitations or swelling in the hands or feet.   No other specific complaints in a complete review of systems (except as listed in HPI above).  Objective:    BP 132/82   Pulse 78  Temp 97.8 F (36.6 C) (Oral)  Ht 5\' 11"  (1.803 m)  Wt 204 lb 4 oz (92.647 kg)  BMI 28.49 kg/m2  SpO2 98% Wt Readings from Last 3 Encounters:  01/22/13 204 lb 4 oz (92.647 kg)  12/25/12 206 lb (93.441 kg)  07/23/12 201 lb (91.173 kg)    General: Appears his stated age, well developed, well nourished in NAD. HEENT: Head: normal shape and size; Eyes: sclera white, no icterus, conjunctiva pink, PERRLA and EOMs intact; Ears: Tm's gray and intact, normal light reflex; Nose: mucosa pink and moist, septum midline; Throat/Mouth: + PND. Teeth present, merythematous and moist, no exudate noted, no lesions or ulcerations noted.  Neck: Mild cervical lymphadenopathy. Neck supple, trachea midline. No massses, lumps or thyromegaly present.  Cardiovascular: Normal rate and rhythm. S1,S2 noted.  No murmur, rubs or gallops noted. No JVD or BLE edema. No carotid bruits noted. Pulmonary/Chest: Normal effort and fine ronchi throughout. No respiratory distress. No wheezes, rales  noted.      Assessment & Plan:   Acute bronchitis, new onset with additional workup required  Get some rest and drink plenty of fluids eRx for Azithromax x 5 days Use OTC cough syrup as needed  RTC as needed or if symptoms persist.

## 2013-01-22 NOTE — Patient Instructions (Signed)

## 2013-01-22 NOTE — Telephone Encounter (Signed)
Ok Zpac He can be seen Sat clinic if needed Thx

## 2013-01-26 ENCOUNTER — Ambulatory Visit (INDEPENDENT_AMBULATORY_CARE_PROVIDER_SITE_OTHER): Payer: Federal, State, Local not specified - PPO | Admitting: Internal Medicine

## 2013-01-26 ENCOUNTER — Ambulatory Visit (INDEPENDENT_AMBULATORY_CARE_PROVIDER_SITE_OTHER)
Admission: RE | Admit: 2013-01-26 | Discharge: 2013-01-26 | Disposition: A | Discharge status: Home / Self Care | Payer: Federal, State, Local not specified - PPO | Source: Ambulatory Visit | Attending: Internal Medicine | Admitting: Internal Medicine

## 2013-01-26 ENCOUNTER — Encounter: Payer: Self-pay | Admitting: Internal Medicine

## 2013-01-26 VITALS — BP 130/90 | HR 80 | Temp 96.9°F | Resp 16 | Wt 205.0 lb

## 2013-01-26 DIAGNOSIS — J209 Acute bronchitis, unspecified: Secondary | ICD-10-CM

## 2013-01-26 MED ORDER — MOXIFLOXACIN HCL 400 MG PO TABS: 400.0000 mg | ORAL_TABLET | Freq: Every day | ORAL | Status: DC

## 2013-01-26 MED ORDER — PROMETHAZINE-CODEINE 6.25-10 MG/5ML PO SYRP: 5.0000 mL | ORAL_SOLUTION | ORAL | Status: DC | PRN

## 2013-01-26 NOTE — Progress Notes (Signed)
HPI  Pt presents to the clinic with 10 days history of sore throat, cough and sputum production. He has been taking Zpac OTC mucinex but the sputum seems to be getting thicker and greener. He was seen about a month ago for sinusitis. He was given Augmentin but he accidentally threw the antibiotics away so he never finished the course of antibiotics. Not better     Past Medical History  Diagnosis Date  . Hx of colonic polyp   . BPH (benign prostatic hypertrophy)     nodularity- Dr. Serena Colonel q 1 year  . Microscopic hematuria     hx of  . Diabetes mellitus type II     Family History  Problem Relation Age of Onset  . Heart disease Father 98  . Hypertension Mother   . Kidney disease Mother     ESRD    History   Social History  . Marital Status: Legally Separated    Spouse Name: N/A    Number of Children: N/A  . Years of Education: N/A   Occupational History  . Not on file.   Social History Main Topics  . Smoking status: Former Smoker    Quit date: 08/28/2002  . Smokeless tobacco: Not on file  . Alcohol Use: No  . Drug Use: No  . Sexually Active: Yes   Other Topics Concern  . Not on file   Social History Narrative  . No narrative on file    No Known Allergies   Constitutional: Positive fatigue. Denies headache, fever or abrupt weight changes.  HEENT:  Positive sore throat. Denies eye redness, ear pain, ringing in the ears, wax buildup, runny nose or bloody nose. Respiratory: Positive cough and thick green sputum production. Denies difficulty breathing or shortness of breath.  Cardiovascular: Denies chest pain, chest tightness, palpitations or swelling in the hands or feet.   No other specific complaints in a complete review of systems (except as listed in HPI above).  Objective:    BP 130/90  Pulse 80  Temp(Src) 96.9 F (36.1 C) (Oral)  Resp 16  Wt 205 lb (92.987 kg)  BMI 28.6 kg/m2 Wt Readings from Last 3 Encounters:  01/26/13 205 lb (92.987 kg)   01/22/13 204 lb 4 oz (92.647 kg)  12/25/12 206 lb (93.441 kg)    General: Appears his stated age, well developed, well nourished in NAD. HEENT: Head: normal shape and size; Eyes: sclera white, no icterus, conjunctiva pink, PERRLA and EOMs intact; Ears: Tm's gray and intact, normal light reflex; Nose: mucosa pink and moist, septum midline; Throat/Mouth: + PND. Teeth present, merythematous and moist, no exudate noted, no lesions or ulcerations noted.  Neck: Mild cervical lymphadenopathy. Neck supple, trachea midline. No massses, lumps or thyromegaly present.  Cardiovascular: Normal rate and rhythm. S1,S2 noted.  No murmur, rubs or gallops noted. No JVD or BLE edema. No carotid bruits noted. Pulmonary/Chest: Normal effort and fine ronchi throughout. No respiratory distress. R rhonchi      Assessment & Plan:   Acute bronchitis, refractory with additional workup required  Get some rest and drink plenty of fluids   RTC as needed or if symptoms persist.

## 2013-01-26 NOTE — Patient Instructions (Addendum)
Use over-the-counter  "cold" medicines  such as  "Afrin" nasal spray for nasal congestion as directed instead. Use" Delsym" or" Robitussin" cough syrup varietis for cough.  You can use plain "Tylenol" or "Advil" for fever, chills and achyness.  Please, make an appointment if you are not better or if you're worse.  

## 2013-01-26 NOTE — Assessment & Plan Note (Signed)
R/o pneumonia - CXR Prom-cod cough syr Avelox x 10 d

## 2013-01-28 ENCOUNTER — Ambulatory Visit: Payer: Federal, State, Local not specified - PPO | Admitting: Internal Medicine

## 2013-05-25 ENCOUNTER — Ambulatory Visit: Payer: Federal, State, Local not specified - PPO | Admitting: Internal Medicine

## 2013-06-14 ENCOUNTER — Other Ambulatory Visit (INDEPENDENT_AMBULATORY_CARE_PROVIDER_SITE_OTHER): Payer: Federal, State, Local not specified - PPO

## 2013-06-14 ENCOUNTER — Other Ambulatory Visit: Payer: Self-pay | Admitting: Internal Medicine

## 2013-06-14 DIAGNOSIS — R030 Elevated blood-pressure reading, without diagnosis of hypertension: Secondary | ICD-10-CM

## 2013-06-14 DIAGNOSIS — N419 Inflammatory disease of prostate, unspecified: Secondary | ICD-10-CM

## 2013-06-14 DIAGNOSIS — E119 Type 2 diabetes mellitus without complications: Secondary | ICD-10-CM

## 2013-06-14 DIAGNOSIS — J019 Acute sinusitis, unspecified: Secondary | ICD-10-CM

## 2013-06-14 LAB — URINALYSIS, ROUTINE W REFLEX MICROSCOPIC
Bilirubin Urine: NEGATIVE
Ketones, ur: NEGATIVE
Urine Glucose: NEGATIVE
Urobilinogen, UA: 0.2 (ref 0.0–1.0)

## 2013-06-14 LAB — LIPID PANEL
Cholesterol: 204 mg/dL — ABNORMAL HIGH (ref 0–200)
HDL: 66.3 mg/dL (ref >39.00)
Total CHOL/HDL Ratio: 3
Triglycerides: 74 mg/dL (ref 0.0–149.0)
VLDL: 14.8 mg/dL (ref 0.0–40.0)

## 2013-06-14 LAB — TSH: TSH: 1.06 u[IU]/mL (ref 0.35–5.50)

## 2013-06-14 LAB — HEPATIC FUNCTION PANEL
ALT: 19 U/L (ref 0–53)
Bilirubin, Direct: 0.1 mg/dL (ref 0.0–0.3)
Total Bilirubin: 0.9 mg/dL (ref 0.3–1.2)

## 2013-06-14 LAB — CBC WITH DIFFERENTIAL/PLATELET
Basophils Relative: 0.3 % (ref 0.0–3.0)
Eosinophils Absolute: 0.2 10*3/uL (ref 0.0–0.7)
Lymphocytes Relative: 25.2 % (ref 12.0–46.0)
MCHC: 33.7 g/dL (ref 30.0–36.0)
Monocytes Absolute: 0.7 10*3/uL (ref 0.1–1.0)
Neutrophils Relative %: 64.9 % (ref 43.0–77.0)
Platelets: 178 10*3/uL (ref 150.0–400.0)
RBC: 5.16 Mil/uL (ref 4.22–5.81)
WBC: 9.6 10*3/uL (ref 4.5–10.5)

## 2013-06-14 LAB — LDL CHOLESTEROL, DIRECT: Direct LDL: 126.5 mg/dL

## 2013-06-14 LAB — HEMOGLOBIN A1C: Hgb A1c MFr Bld: 6.5 % (ref 4.6–6.5)

## 2013-06-14 LAB — BASIC METABOLIC PANEL
BUN: 12 mg/dL (ref 6–23)
Calcium: 9.2 mg/dL (ref 8.4–10.5)
Creatinine, Ser: 0.9 mg/dL (ref 0.4–1.5)

## 2013-06-16 ENCOUNTER — Other Ambulatory Visit: Payer: Federal, State, Local not specified - PPO

## 2013-06-16 ENCOUNTER — Other Ambulatory Visit: Payer: Self-pay | Admitting: *Deleted

## 2013-06-16 DIAGNOSIS — E119 Type 2 diabetes mellitus without complications: Secondary | ICD-10-CM

## 2013-06-16 DIAGNOSIS — N419 Inflammatory disease of prostate, unspecified: Secondary | ICD-10-CM

## 2013-06-16 DIAGNOSIS — N400 Enlarged prostate without lower urinary tract symptoms: Secondary | ICD-10-CM

## 2013-06-19 LAB — CULTURE, URINE COMPREHENSIVE: Colony Count: >=100000

## 2013-06-21 ENCOUNTER — Encounter: Payer: Self-pay | Admitting: Internal Medicine

## 2013-06-21 ENCOUNTER — Ambulatory Visit (INDEPENDENT_AMBULATORY_CARE_PROVIDER_SITE_OTHER): Payer: Federal, State, Local not specified - PPO | Admitting: Internal Medicine

## 2013-06-21 VITALS — BP 114/88 | HR 80 | Temp 98.0°F | Resp 16 | Wt 205.0 lb

## 2013-06-21 DIAGNOSIS — N400 Enlarged prostate without lower urinary tract symptoms: Secondary | ICD-10-CM

## 2013-06-21 DIAGNOSIS — Z0000 Encounter for general adult medical examination without abnormal findings: Secondary | ICD-10-CM

## 2013-06-21 DIAGNOSIS — E119 Type 2 diabetes mellitus without complications: Secondary | ICD-10-CM

## 2013-06-21 DIAGNOSIS — N390 Urinary tract infection, site not specified: Secondary | ICD-10-CM

## 2013-06-21 MED ORDER — CIPROFLOXACIN HCL 500 MG PO TABS: 500.0000 mg | ORAL_TABLET | Freq: Two times a day (BID) | ORAL | Status: DC

## 2013-06-21 NOTE — Progress Notes (Signed)
   Subjective:    HPI The patient presents for a follow-up of  chronic pre hypertension, UTIs, type 2 pre diabetes controlled with diet UTI on 06/16/13 - on Cipro per UC  Wt Readings from Last 3 Encounters:  06/21/13 205 lb (92.987 kg)  01/26/13 205 lb (92.987 kg)  01/22/13 204 lb 4 oz (92.647 kg)   BP Readings from Last 3 Encounters:  06/21/13 114/88  01/26/13 130/90  01/22/13 132/82      Review of Systems  Constitutional: Negative for appetite change, fatigue and unexpected weight change.  HENT: Positive for sore throat (1 day). Negative for nosebleeds, congestion, sneezing, trouble swallowing and neck pain.   Eyes: Negative for itching and visual disturbance.  Respiratory: Negative for cough.   Cardiovascular: Negative for chest pain, palpitations and leg swelling.  Gastrointestinal: Negative for nausea, diarrhea, blood in stool and abdominal distention.  Genitourinary: Negative for frequency and hematuria.  Musculoskeletal: Negative for back pain, joint swelling and gait problem.  Skin: Negative for rash.  Neurological: Negative for dizziness, tremors, speech difficulty and weakness.  Psychiatric/Behavioral: Negative for sleep disturbance, dysphoric mood and agitation. The patient is not nervous/anxious.        Objective:   Physical Exam  Constitutional: He is oriented to person, place, and time. He appears well-developed.  HENT:  Mouth/Throat: Oropharynx is clear and moist.  eryth throat  Eyes: Conjunctivae are normal. Pupils are equal, round, and reactive to light.  Neck: Normal range of motion. No JVD present. No thyromegaly present.  Cardiovascular: Normal rate, regular rhythm, normal heart sounds and intact distal pulses.  Exam reveals no gallop and no friction rub.   No murmur heard. Pulmonary/Chest: Effort normal and breath sounds normal. No respiratory distress. He has no wheezes. He has no rales. He exhibits no tenderness.  Abdominal: Soft. Bowel sounds are  normal. He exhibits no distension and no mass. There is no tenderness. There is no rebound and no guarding.  Musculoskeletal: Normal range of motion. He exhibits no edema and no tenderness.  Lymphadenopathy:    He has no cervical adenopathy.  Neurological: He is alert and oriented to person, place, and time. He has normal reflexes. No cranial nerve deficit. He exhibits normal muscle tone. Coordination normal.  Skin: Skin is warm and dry. No rash noted.  Psychiatric: He has a normal mood and affect. His behavior is normal. Judgment and thought content normal.     Lab Results  Component Value Date   WBC 9.6 06/14/2013   HGB 16.8 06/14/2013   HCT 49.9 06/14/2013   PLT 178.0 06/14/2013   GLUCOSE 119* 06/14/2013   CHOL 204* 06/14/2013   TRIG 74.0 06/14/2013   HDL 66.30 06/14/2013   LDLDIRECT 126.5 06/14/2013   LDLCALC 101* 07/16/2012   ALT 19 06/14/2013   AST 16 06/14/2013   NA 139 06/14/2013   K 4.1 06/14/2013   CL 106 06/14/2013   CREATININE 0.9 06/14/2013   BUN 12 06/14/2013   CO2 28 06/14/2013   TSH 1.06 06/14/2013   PSA 1.50 07/16/2012   HGBA1C 6.5 06/14/2013   Abn UA    Assessment & Plan:

## 2013-06-21 NOTE — Assessment & Plan Note (Signed)
Culture ENTEROBACTER CLOACAE     Colony Count >=100,000 COLONIES/ML     Organism ID, Bacteria ENTEROBACTER CLOACAE     Resulting Agency [facility]      Culture & Susceptibility        Antibiotic   Organism Organism Organism        ENTEROBACTER CLOACAE        CEFAZOLIN    >=64   R Final            CEFEPIME    <=1   S Final            CEFOXITIN    >=64   R Final            CEFTAZIDIME    <=1   S Final            CEFTRIAXONE    <=1   S Final            CIPROFLOXACIN    1   S Final            GENTAMICIN    <=1   S Final            IMIPENEM    <=0.25   S Final            LEVOFLOXACIN    2   S Final            NITROFURANTOIN    128   R Final            PIP/TAZO    >=128   R Final            TOBRAMYCIN    <=1   S Final            TRIMETH/SULFA    <=20   S Final                            Result Narrative        Performed at:  [facility]                [address], [address]                [place], [place] [address]         Specimen Collected: 06/16/13  4:59 PM Last Resulted: 06/19/13  9:12 AM                 On Cipro - better

## 2013-06-21 NOTE — Assessment & Plan Note (Signed)
Continue with current prescription therapy as reflected on the Med list.  

## 2013-06-21 NOTE — Assessment & Plan Note (Signed)
Dr Dahlstedt      

## 2013-09-20 ENCOUNTER — Ambulatory Visit (INDEPENDENT_AMBULATORY_CARE_PROVIDER_SITE_OTHER): Payer: Federal, State, Local not specified - PPO | Admitting: *Deleted

## 2013-09-20 DIAGNOSIS — Z23 Encounter for immunization: Secondary | ICD-10-CM

## 2013-10-21 ENCOUNTER — Other Ambulatory Visit: Payer: Self-pay

## 2013-12-13 ENCOUNTER — Other Ambulatory Visit: Payer: Self-pay | Admitting: Internal Medicine

## 2013-12-13 ENCOUNTER — Other Ambulatory Visit (INDEPENDENT_AMBULATORY_CARE_PROVIDER_SITE_OTHER): Payer: Federal, State, Local not specified - PPO

## 2013-12-13 DIAGNOSIS — Z0000 Encounter for general adult medical examination without abnormal findings: Secondary | ICD-10-CM

## 2013-12-13 DIAGNOSIS — E119 Type 2 diabetes mellitus without complications: Secondary | ICD-10-CM

## 2013-12-13 DIAGNOSIS — N390 Urinary tract infection, site not specified: Secondary | ICD-10-CM

## 2013-12-13 DIAGNOSIS — N400 Enlarged prostate without lower urinary tract symptoms: Secondary | ICD-10-CM

## 2013-12-13 LAB — URINALYSIS, ROUTINE W REFLEX MICROSCOPIC
Specific Gravity, Urine: >=1.03 — AB (ref 1.000–1.030)
Urobilinogen, UA: 0.2 (ref 0.0–1.0)

## 2013-12-13 LAB — HEPATIC FUNCTION PANEL
ALT: 19 U/L (ref 0–53)
AST: 18 U/L (ref 0–37)
Albumin: 4.2 g/dL (ref 3.5–5.2)
Alkaline Phosphatase: 46 U/L (ref 39–117)
Total Protein: 7 g/dL (ref 6.0–8.3)

## 2013-12-13 LAB — TSH: TSH: 1.27 u[IU]/mL (ref 0.35–5.50)

## 2013-12-13 LAB — CBC WITH DIFFERENTIAL/PLATELET
Basophils Relative: 0.5 % (ref 0.0–3.0)
Eosinophils Relative: 2.5 % (ref 0.0–5.0)
HCT: 46.8 % (ref 39.0–52.0)
MCV: 93.6 fl (ref 78.0–100.0)
Monocytes Absolute: 0.6 10*3/uL (ref 0.1–1.0)
Neutrophils Relative %: 56.4 % (ref 43.0–77.0)
RBC: 4.99 Mil/uL (ref 4.22–5.81)
WBC: 7 10*3/uL (ref 4.5–10.5)

## 2013-12-13 LAB — BASIC METABOLIC PANEL
BUN: 16 mg/dL (ref 6–23)
GFR: 115.89 mL/min (ref >60.00)
Glucose, Bld: 114 mg/dL — ABNORMAL HIGH (ref 70–99)
Potassium: 3.9 mEq/L (ref 3.5–5.1)

## 2013-12-13 LAB — LIPID PANEL: HDL: 55.2 mg/dL (ref >39.00)

## 2013-12-13 LAB — PSA: PSA: 3.39 ng/mL (ref 0.10–4.00)

## 2013-12-13 MED ORDER — CIPROFLOXACIN HCL 500 MG PO TABS: 500.0000 mg | ORAL_TABLET | Freq: Two times a day (BID) | ORAL | Status: DC

## 2013-12-22 ENCOUNTER — Encounter: Payer: Self-pay | Admitting: Internal Medicine

## 2013-12-22 ENCOUNTER — Ambulatory Visit (INDEPENDENT_AMBULATORY_CARE_PROVIDER_SITE_OTHER): Payer: Federal, State, Local not specified - PPO | Admitting: Internal Medicine

## 2013-12-22 VITALS — BP 130/80 | HR 53 | Temp 97.5°F | Resp 16 | Ht 71.0 in | Wt 206.0 lb

## 2013-12-22 DIAGNOSIS — J209 Acute bronchitis, unspecified: Secondary | ICD-10-CM

## 2013-12-22 DIAGNOSIS — Z23 Encounter for immunization: Secondary | ICD-10-CM

## 2013-12-22 DIAGNOSIS — R0789 Other chest pain: Secondary | ICD-10-CM | POA: Insufficient documentation

## 2013-12-22 DIAGNOSIS — E119 Type 2 diabetes mellitus without complications: Secondary | ICD-10-CM

## 2013-12-22 DIAGNOSIS — Z0000 Encounter for general adult medical examination without abnormal findings: Secondary | ICD-10-CM

## 2013-12-22 DIAGNOSIS — R071 Chest pain on breathing: Secondary | ICD-10-CM

## 2013-12-22 DIAGNOSIS — M940 Chondrocostal junction syndrome [Tietze]: Secondary | ICD-10-CM

## 2013-12-22 MED ORDER — MELOXICAM 15 MG PO TABS: 15.0000 mg | ORAL_TABLET | Freq: Every day | ORAL | Status: DC

## 2013-12-22 MED ORDER — METFORMIN HCL 500 MG PO TABS: 500.0000 mg | ORAL_TABLET | Freq: Every day | ORAL | Status: DC

## 2013-12-22 NOTE — Progress Notes (Signed)
Subjective:    HPI  The patient is here for a wellness exam. The patient has been doing well overall without major physical or psychological issues going on lately.  C/o R>L CP w/movement - severe x 2 wks after lifting a box  The patient presents for a follow-up of  chronic pre hypertension, UTIs, type 2 pre diabetes controlled with diet The patient is here for a wellness exam. The patient has been doing well overall without major physical or psychological issues going on lately.  Wt Readings from Last 3 Encounters:  12/22/13 206 lb (93.441 kg)  06/21/13 205 lb (92.987 kg)  01/26/13 205 lb (92.987 kg)   BP Readings from Last 3 Encounters:  12/22/13 130/80  06/21/13 114/88  01/26/13 130/90      Review of Systems  Constitutional: Negative for appetite change, fatigue and unexpected weight change.  HENT: Positive for sore throat (1 day). Negative for congestion, nosebleeds, sneezing and trouble swallowing.   Eyes: Negative for itching and visual disturbance.  Respiratory: Negative for cough.   Cardiovascular: Negative for chest pain, palpitations and leg swelling.  Gastrointestinal: Negative for nausea, diarrhea, blood in stool and abdominal distention.  Genitourinary: Negative for frequency and hematuria.  Musculoskeletal: Negative for back pain, gait problem, joint swelling and neck pain.  Skin: Negative for rash.  Neurological: Negative for dizziness, tremors, speech difficulty and weakness.  Psychiatric/Behavioral: Negative for sleep disturbance, dysphoric mood and agitation. The patient is not nervous/anxious.        Objective:   Physical Exam  Constitutional: He is oriented to person, place, and time. He appears well-developed.  HENT:  Mouth/Throat: Oropharynx is clear and moist.  eryth throat  Eyes: Conjunctivae are normal. Pupils are equal, round, and reactive to light.  Neck: Normal range of motion. No JVD present. No thyromegaly present.  Cardiovascular:  Normal rate, regular rhythm, normal heart sounds and intact distal pulses.  Exam reveals no gallop and no friction rub.   No murmur heard. Pulmonary/Chest: Effort normal and breath sounds normal. No respiratory distress. He has no wheezes. He has no rales. He exhibits no tenderness.  Abdominal: Soft. Bowel sounds are normal. He exhibits no distension and no mass. There is no tenderness. There is no rebound and no guarding.  Musculoskeletal: Normal range of motion. He exhibits no edema and no tenderness.  Lymphadenopathy:    He has no cervical adenopathy.  Neurological: He is alert and oriented to person, place, and time. He has normal reflexes. No cranial nerve deficit. He exhibits normal muscle tone. Coordination normal.  Skin: Skin is warm and dry. No rash noted.  Psychiatric: He has a normal mood and affect. His behavior is normal. Judgment and thought content normal.  He had rectal in Feb w/Dr D. - declined today L costochondral area is very tender   Lab Results  Component Value Date   WBC 7.0 12/13/2013   HGB 15.4 12/13/2013   HCT 46.8 12/13/2013   PLT 250.0 12/13/2013   GLUCOSE 114* 12/13/2013   CHOL 184 12/13/2013   TRIG 51.0 12/13/2013   HDL 55.20 12/13/2013   LDLDIRECT 126.5 06/14/2013   LDLCALC 119* 12/13/2013   ALT 19 12/13/2013   AST 18 12/13/2013   NA 142 12/13/2013   K 3.9 12/13/2013   CL 110 12/13/2013   CREATININE 0.9 12/13/2013   BUN 16 12/13/2013   CO2 23 12/13/2013   TSH 1.27 12/13/2013   PSA 3.39 12/13/2013   HGBA1C 6.7* 12/13/2013   Abn  UA    Assessment & Plan:

## 2013-12-22 NOTE — Assessment & Plan Note (Signed)
EKG CXR

## 2013-12-22 NOTE — Assessment & Plan Note (Signed)
Continue with current prescription therapy as reflected on the Med list.  

## 2013-12-22 NOTE — Assessment & Plan Note (Signed)
See meds 

## 2013-12-22 NOTE — Assessment & Plan Note (Signed)
We discussed age appropriate health related issues, including available/recomended screening tests and vaccinations. We discussed a need for adhering to healthy diet and exercise. Labs/EKG were reviewed/ordered. All questions were answered.   

## 2013-12-23 ENCOUNTER — Telehealth: Payer: Self-pay

## 2013-12-23 NOTE — Telephone Encounter (Signed)
Relevant patient education mailed to patient.  

## 2014-01-24 ENCOUNTER — Encounter: Payer: Federal, State, Local not specified - PPO | Admitting: Internal Medicine

## 2014-02-21 ENCOUNTER — Telehealth: Payer: Self-pay | Admitting: Internal Medicine

## 2014-02-21 MED ORDER — MELOXICAM 15 MG PO TABS: 15.0000 mg | ORAL_TABLET | Freq: Every day | ORAL | Status: DC

## 2014-02-21 NOTE — Telephone Encounter (Signed)
02/21/2014  Pt needs is requesting a refill on rx meloxicam (MOBIC) 15 MG tablet.  Thanks.

## 2014-02-21 NOTE — Telephone Encounter (Signed)
Done

## 2014-06-15 ENCOUNTER — Other Ambulatory Visit: Payer: Federal, State, Local not specified - PPO

## 2014-06-22 ENCOUNTER — Ambulatory Visit (INDEPENDENT_AMBULATORY_CARE_PROVIDER_SITE_OTHER): Payer: Federal, State, Local not specified - PPO | Admitting: Internal Medicine

## 2014-06-22 ENCOUNTER — Other Ambulatory Visit (INDEPENDENT_AMBULATORY_CARE_PROVIDER_SITE_OTHER): Payer: Federal, State, Local not specified - PPO

## 2014-06-22 ENCOUNTER — Encounter: Payer: Self-pay | Admitting: Internal Medicine

## 2014-06-22 VITALS — BP 132/86 | HR 80 | Temp 97.7°F | Resp 16 | Wt 208.0 lb

## 2014-06-22 DIAGNOSIS — N400 Enlarged prostate without lower urinary tract symptoms: Secondary | ICD-10-CM

## 2014-06-22 DIAGNOSIS — N390 Urinary tract infection, site not specified: Secondary | ICD-10-CM

## 2014-06-22 DIAGNOSIS — E119 Type 2 diabetes mellitus without complications: Secondary | ICD-10-CM

## 2014-06-22 DIAGNOSIS — R7309 Other abnormal glucose: Secondary | ICD-10-CM

## 2014-06-22 DIAGNOSIS — R030 Elevated blood-pressure reading, without diagnosis of hypertension: Secondary | ICD-10-CM

## 2014-06-22 LAB — URINALYSIS, ROUTINE W REFLEX MICROSCOPIC
BILIRUBIN URINE: NEGATIVE
Nitrite: POSITIVE — AB
Specific Gravity, Urine: >=1.03 — AB (ref 1.000–1.030)
Total Protein, Urine: 30 — AB
URINE GLUCOSE: NEGATIVE
UROBILINOGEN UA: 0.2 (ref 0.0–1.0)
pH: 6 (ref 5.0–8.0)

## 2014-06-22 LAB — HEMOGLOBIN A1C: HEMOGLOBIN A1C: 6.4 % (ref 4.6–6.5)

## 2014-06-22 LAB — BASIC METABOLIC PANEL
BUN: 15 mg/dL (ref 6–23)
CALCIUM: 9.8 mg/dL (ref 8.4–10.5)
CO2: 27 mEq/L (ref 19–32)
CREATININE: 1 mg/dL (ref 0.4–1.5)
Chloride: 101 mEq/L (ref 96–112)
GFR: 101.9 mL/min (ref >60.00)
Glucose, Bld: 102 mg/dL — ABNORMAL HIGH (ref 70–99)
Potassium: 4.7 mEq/L (ref 3.5–5.1)
Sodium: 135 mEq/L (ref 135–145)

## 2014-06-22 LAB — PSA: PSA: 1.46 ng/mL (ref 0.10–4.00)

## 2014-06-22 NOTE — Progress Notes (Signed)
Pre visit review using our clinic review tool, if applicable. No additional management support is needed unless otherwise documented below in the visit note. 

## 2014-06-22 NOTE — Assessment & Plan Note (Signed)
Chronic pre-DM 1/14 DM2

## 2014-06-22 NOTE — Assessment & Plan Note (Signed)
UA

## 2014-06-22 NOTE — Assessment & Plan Note (Signed)
PSA

## 2014-06-22 NOTE — Progress Notes (Signed)
   Subjective:    HPI      The patient presents for a follow-up of  chronic pre hypertension, UTIs, type 2 pre diabetes controlled with diet The patient is here for a wellness exam. The patient has been doing well overall without major physical or psychological issues going on lately.  Wt Readings from Last 3 Encounters:  06/22/14 208 lb (94.348 kg)  12/22/13 206 lb (93.441 kg)  06/21/13 205 lb (92.987 kg)   BP Readings from Last 3 Encounters:  06/22/14 132/86  12/22/13 130/80  06/21/13 114/88      Review of Systems  Constitutional: Negative for appetite change, fatigue and unexpected weight change.  HENT: Positive for sore throat (1 day). Negative for congestion, nosebleeds, sneezing and trouble swallowing.   Eyes: Negative for itching and visual disturbance.  Respiratory: Negative for cough.   Cardiovascular: Negative for chest pain, palpitations and leg swelling.  Gastrointestinal: Negative for nausea, diarrhea, blood in stool and abdominal distention.  Genitourinary: Negative for frequency and hematuria.  Musculoskeletal: Negative for back pain, gait problem, joint swelling and neck pain.  Skin: Negative for rash.  Neurological: Negative for dizziness, tremors, speech difficulty and weakness.  Psychiatric/Behavioral: Negative for sleep disturbance, dysphoric mood and agitation. The patient is not nervous/anxious.        Objective:   Physical Exam  Constitutional: He is oriented to person, place, and time. He appears well-developed.  HENT:  Mouth/Throat: Oropharynx is clear and moist.  eryth throat  Eyes: Conjunctivae are normal. Pupils are equal, round, and reactive to light.  Neck: Normal range of motion. No JVD present. No thyromegaly present.  Cardiovascular: Normal rate, regular rhythm, normal heart sounds and intact distal pulses.  Exam reveals no gallop and no friction rub.   No murmur heard. Pulmonary/Chest: Effort normal and breath sounds normal. No  respiratory distress. He has no wheezes. He has no rales. He exhibits no tenderness.  Abdominal: Soft. Bowel sounds are normal. He exhibits no distension and no mass. There is no tenderness. There is no rebound and no guarding.  Musculoskeletal: Normal range of motion. He exhibits no edema and no tenderness.  Lymphadenopathy:    He has no cervical adenopathy.  Neurological: He is alert and oriented to person, place, and time. He has normal reflexes. No cranial nerve deficit. He exhibits normal muscle tone. Coordination normal.  Skin: Skin is warm and dry. No rash noted.  Psychiatric: He has a normal mood and affect. His behavior is normal. Judgment and thought content normal.     Lab Results  Component Value Date   WBC 7.0 12/13/2013   HGB 15.4 12/13/2013   HCT 46.8 12/13/2013   PLT 250.0 12/13/2013   GLUCOSE 114* 12/13/2013   CHOL 184 12/13/2013   TRIG 51.0 12/13/2013   HDL 55.20 12/13/2013   LDLDIRECT 126.5 06/14/2013   LDLCALC 119* 12/13/2013   ALT 19 12/13/2013   AST 18 12/13/2013   NA 142 12/13/2013   K 3.9 12/13/2013   CL 110 12/13/2013   CREATININE 0.9 12/13/2013   BUN 16 12/13/2013   CO2 23 12/13/2013   TSH 1.27 12/13/2013   PSA 3.39 12/13/2013   HGBA1C 6.7* 12/13/2013   Abn UA    Assessment & Plan:

## 2014-06-22 NOTE — Assessment & Plan Note (Signed)
BMET Watching labs

## 2014-06-22 NOTE — Assessment & Plan Note (Signed)
Labs

## 2014-06-23 MED ORDER — CIPROFLOXACIN HCL 500 MG PO TABS: 500.0000 mg | ORAL_TABLET | Freq: Two times a day (BID) | ORAL | Status: DC

## 2014-09-27 ENCOUNTER — Ambulatory Visit (INDEPENDENT_AMBULATORY_CARE_PROVIDER_SITE_OTHER): Payer: Federal, State, Local not specified - PPO

## 2014-09-27 DIAGNOSIS — Z23 Encounter for immunization: Secondary | ICD-10-CM

## 2014-09-28 ENCOUNTER — Ambulatory Visit (INDEPENDENT_AMBULATORY_CARE_PROVIDER_SITE_OTHER): Payer: Federal, State, Local not specified - PPO | Admitting: Internal Medicine

## 2014-09-28 ENCOUNTER — Other Ambulatory Visit (INDEPENDENT_AMBULATORY_CARE_PROVIDER_SITE_OTHER): Payer: Federal, State, Local not specified - PPO

## 2014-09-28 ENCOUNTER — Encounter: Payer: Self-pay | Admitting: Internal Medicine

## 2014-09-28 VITALS — BP 120/82 | HR 75 | Temp 97.8°F | Wt 206.4 lb

## 2014-09-28 DIAGNOSIS — I493 Ventricular premature depolarization: Secondary | ICD-10-CM

## 2014-09-28 DIAGNOSIS — I499 Cardiac arrhythmia, unspecified: Secondary | ICD-10-CM

## 2014-09-28 DIAGNOSIS — K645 Perianal venous thrombosis: Secondary | ICD-10-CM

## 2014-09-28 MED ORDER — HYDROCORTISONE ACE-PRAMOXINE 1-1 % RE FOAM: RECTAL | Status: DC

## 2014-09-28 NOTE — Progress Notes (Signed)
Pre visit review using our clinic review tool, if applicable. No additional management support is needed unless otherwise documented below in the visit note. 

## 2014-09-28 NOTE — Progress Notes (Signed)
   Subjective:    Patient ID: [name], male    DOB: 1951-01-17, 63 y.o.   MRN: [id number]  HPI   He's had a flare of his hemorrhoids since 09/22/14. It occurred after ingesting milk and dairy products.  He hadn't realized that ingesting ice cream was a major trigger but there was definite improvement stopping or decreasing milk and dairy products. Lactaide was of no benefit  He used Preparation H and witch hazel without benefit.  He did have intermittent bright red rectal bleeding.  He has had no other bleeding dyscrasia.  Colonoscopy revealed a hyperplastic polyp in 2010    Review of Systems Epistaxis, hemoptysis, hematuria,or melena denied. No unexplained weight loss, significant dyspepsia,dysphagia, or abdominal pain.  There is no abnormal bruising , bleeding, or difficulty stopping bleeding with injury.     Objective:   Physical Exam  Positive or pertinent findings include: Arcus senilis is present. Heart rhythm is irregular clinically suggesting slow atrial fibrillation He has a slightly thrombosed hemorrhoid at the anus @ 7:00 o'clock.  General appearance :adequately nourished; in no distress. Eyes: No conjunctival inflammation ;slight scleritis. Oral exam: Dental hygiene is good. Lips and gums are healthy appearing.There is no oropharyngeal erythema or exudate noted.  Heart:  No gallop, murmur, click, rub or other extra sounds  Lungs:Chest clear to auscultation; no wheezes, rhonchi,rales ,or rubs present.No increased work of breathing.  Abdomen: bowel sounds normal, soft and non-tender without masses, organomegaly or hernias noted.  No guarding or rebound.  Vascular : all pulses equal ; no bruits present. Skin:Warm & dry.  Intact without suspicious lesions or rashes ; no jaundice or tenting Lymphatic: No lymphadenopathy is noted about the head, neck, axilla.             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

## 2014-09-28 NOTE — Progress Notes (Signed)
   Subjective:    Patient ID: [name], male    DOB: 1951/06/25, 63 y.o.   MRN: [id number]  HPI  Patient is seen today for evaluation for hemorrhoids.    He states that he has had hemorrhoids that occur intermittently for the last 1-2 years. They primarily occur after eating dairy products.  He has decreased dairy intake and also tried Lactaid in the past.    On 09/22/2014, he developed recurrent hemorrhoids.  These have not resolved with preparation H or witch hazel.    He has had some BRBPR when patting dry after bowel movement.  He has also had pain.    He works at the [employer] and his job involves lifting.      Review of Systems  He denies fevers, chills, change in bowel habits, melena, nausea or vomiting.       Objective:   Physical Exam        Assessment & Plan:

## 2014-09-28 NOTE — Patient Instructions (Addendum)
  Cleanse rectal area with lather of mild shampoo in shower  as discussed. After bowel movement,use tissue to cleanse the bulk of stool & finish up with TUCKS  or Baby Wipes.  Sitz baths followed by the  Medication 2 to 3 times a day to shrink the hemorrhoids. Stay well hydrated and avoid popcorn and some other materials which might aggravate hemorrhoids.   To prevent palpitations or premature beats, avoid stimulants such as decongestants, diet pills, nicotine, or caffeine (coffee, tea, cola, or chocolate) to excess.

## 2014-09-29 LAB — BASIC METABOLIC PANEL
BUN: 13 mg/dL (ref 6–23)
CHLORIDE: 104 meq/L (ref 96–112)
CO2: 31 mEq/L (ref 19–32)
CREATININE: 1 mg/dL (ref 0.4–1.5)
Calcium: 9.1 mg/dL (ref 8.4–10.5)
GFR: 100.6 mL/min (ref >60.00)
Glucose, Bld: 94 mg/dL (ref 70–99)
Potassium: 4.5 mEq/L (ref 3.5–5.1)
Sodium: 138 mEq/L (ref 135–145)

## 2014-09-29 LAB — MAGNESIUM: MAGNESIUM: 1.9 mg/dL (ref 1.5–2.5)

## 2014-09-29 LAB — TSH: TSH: 0.73 u[IU]/mL (ref 0.35–4.50)

## 2014-10-31 ENCOUNTER — Ambulatory Visit (INDEPENDENT_AMBULATORY_CARE_PROVIDER_SITE_OTHER): Payer: Federal, State, Local not specified - PPO | Admitting: Cardiology

## 2014-10-31 ENCOUNTER — Encounter: Payer: Self-pay | Admitting: Cardiology

## 2014-10-31 VITALS — BP 122/80 | HR 94 | Ht 71.0 in | Wt 207.0 lb

## 2014-10-31 DIAGNOSIS — I482 Chronic atrial fibrillation, unspecified: Secondary | ICD-10-CM

## 2014-10-31 DIAGNOSIS — I493 Ventricular premature depolarization: Secondary | ICD-10-CM

## 2014-10-31 DIAGNOSIS — I499 Cardiac arrhythmia, unspecified: Secondary | ICD-10-CM

## 2014-10-31 DIAGNOSIS — R9431 Abnormal electrocardiogram [ECG] [EKG]: Secondary | ICD-10-CM | POA: Insufficient documentation

## 2014-10-31 NOTE — Addendum Note (Signed)
Addended by: Vincenza HewsWILDMAN, Ranard Harte C. on: 10/31/2014 12:52 PM   Modules accepted: Orders

## 2014-10-31 NOTE — Patient Instructions (Signed)
Your physician recommends that you schedule a follow-up appointment in:  as needed  We are ordering a treadmill test  

## 2014-10-31 NOTE — Progress Notes (Signed)
HPI Take presents for evaluation of PVCs. This was noted recently during a regular examination. He was having bigeminy. He's not had any cardiac history. He does not notice these. He doesn't have palpitations, presyncope or syncope. He doesn't have chest pressure, neck or arm discomfort. He has no weight gain or edema. 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 With this he denies any cardiovascular symptoms.  No Known Allergies  Current Outpatient Prescriptions  Medication Sig Dispense Refill  . aspirin 81 MG EC tablet Take 81 mg by mouth daily.      . cholecalciferol (VITAMIN D) 1000 UNITS tablet Take 1,000 Units by mouth daily.      . metFORMIN (GLUCOPHAGE) 500 MG tablet Take 1 tablet (500 mg total) by mouth daily with breakfast. 90 tablet 3  . Omega-3 Fatty Acids (FISH OIL) 1000 MG CAPS Take 1 capsule by mouth 2 (two) times daily.       No current facility-administered medications for this visit.    Past Medical History  Diagnosis Date  . Hx of colonic polyp   . BPH (benign prostatic hypertrophy)     nodularity- Dr. Serena [place] q 1 year  . Microscopic hematuria     hx of  . Diabetes mellitus type II     Past Surgical History  Procedure Laterality Date  . Cholecystectomy    . Colonoscopy w/ polypectomy      Family History  Problem Relation Age of Onset  . Heart disease Father [id number]    No details  . Hypertension Mother   . Kidney disease Mother     ESRD    History   Social History  . Marital Status: Legally Separated    Spouse Name: N/A    Number of Children: N/A  . Years of Education: N/A   Occupational History  . Not on file.   Social History Main Topics  . Smoking status: Former Smoker    Quit date: 08/28/2002  . Smokeless tobacco: Not on file  . Alcohol Use: No  . Drug Use: No  . Sexual Activity: Yes   Other Topics Concern  . Not on file   Social History Narrative   Lives alone.  One daughter.  Works in the post office.      ROS:  As  stated in the HPI and negative for all other systems.  PHYSICAL EXAM BP 122/80 mmHg  Pulse 94  Ht 5\' 11"  (1.803 m)  Wt 207 lb (93.895 kg)  BMI 28.88 kg/m2  GENERAL:  Well appearing HEENT:  Pupils equal round and reactive, fundi not visualized, oral mucosa unremarkable NECK:  No jugular venous distention, waveform within normal limits, carotid upstroke brisk and symmetric, no bruits, no thyromegaly LYMPHATICS:  No cervical, inguinal adenopathy LUNGS:  Clear to auscultation bilaterally BACK:  No CVA tenderness CHEST:  Unremarkable HEART:  PMI not displaced or sustained,S1 and S2 within normal limits, no S3, no S4, no clicks, no rubs, no murmurs ABD:  Flat, positive bowel sounds normal in frequency in pitch, no bruits, no rebound, no guarding, no midline pulsatile mass, no hepatomegaly, no splenomegaly EXT:  2 plus pulses throughout, no edema, no cyanosis no clubbing SKIN:  No rashes no nodules NEURO:  Cranial nerves II through XII grossly intact, motor grossly intact throughout PSYCH:  Cognitively intact, oriented to person place and time   EKG:I and I will apply  Sinus rhythm, rate 94, axis within normal limits, intervals within normal limits,  nonspecific anterior T-wave flattening.  10/31/2014  ASSESSMENT AND PLAN  PVCs: He has no symptoms related to this. I don't strongly suspect structural heart disease. No further cardiovascular testing is suggested specifically for this.  ABNORMAL EKG:  He does have a slightly abnormal EKG and significant cardiovascular risk factors.  I will bring the patient back for a POET (Plain Old Exercise Test). This will allow me to screen for obstructive coronary disease, risk stratify and very importantly provide a prescription for exercise.  DYSLIPIDEMIA:  He would have indication for therapy with his diabetes as her primary prevention. His labs are listed below. However, I did discuss with him diet and exercise and I think he could achieve of diabetes  control and lipid control with this.  Lab Results  Component Value Date   CHOL 184 12/13/2013   TRIG 51.0 12/13/2013   HDL 55.20 12/13/2013   LDLCALC 119* 12/13/2013   LDLDIRECT 126.5 06/14/2013

## 2014-11-28 ENCOUNTER — Encounter: Payer: Self-pay | Admitting: Internal Medicine

## 2014-12-12 ENCOUNTER — Other Ambulatory Visit: Payer: Self-pay | Admitting: Internal Medicine

## 2014-12-26 ENCOUNTER — Other Ambulatory Visit (INDEPENDENT_AMBULATORY_CARE_PROVIDER_SITE_OTHER): Payer: Federal, State, Local not specified - PPO

## 2014-12-26 ENCOUNTER — Encounter: Payer: Self-pay | Admitting: Internal Medicine

## 2014-12-26 ENCOUNTER — Ambulatory Visit (INDEPENDENT_AMBULATORY_CARE_PROVIDER_SITE_OTHER): Payer: Federal, State, Local not specified - PPO | Admitting: Internal Medicine

## 2014-12-26 VITALS — BP 134/84 | HR 72 | Temp 97.8°F | Wt 210.0 lb

## 2014-12-26 DIAGNOSIS — N411 Chronic prostatitis: Secondary | ICD-10-CM

## 2014-12-26 DIAGNOSIS — E119 Type 2 diabetes mellitus without complications: Secondary | ICD-10-CM

## 2014-12-26 DIAGNOSIS — N400 Enlarged prostate without lower urinary tract symptoms: Secondary | ICD-10-CM

## 2014-12-26 DIAGNOSIS — Z23 Encounter for immunization: Secondary | ICD-10-CM

## 2014-12-26 LAB — URINALYSIS, ROUTINE W REFLEX MICROSCOPIC
Bilirubin Urine: NEGATIVE
Ketones, ur: NEGATIVE
NITRITE: NEGATIVE
Specific Gravity, Urine: >=1.03 — AB (ref 1.000–1.030)
TOTAL PROTEIN, URINE-UPE24: NEGATIVE
Urine Glucose: 100 — AB
Urobilinogen, UA: 0.2 (ref 0.0–1.0)
pH: 5.5 (ref 5.0–8.0)

## 2014-12-26 LAB — HEMOGLOBIN A1C: Hgb A1c MFr Bld: 6.9 % — ABNORMAL HIGH (ref 4.6–6.5)

## 2014-12-26 NOTE — Progress Notes (Signed)
   Subjective:    HPI      The patient presents for a follow-up of  chronic pre hypertension, UTIs, type 2 pre diabetes controlled with diet The patient is here for a wellness exam. The patient has been doing well overall without major physical or psychological issues going on lately.  Wt Readings from Last 3 Encounters:  12/26/14 210 lb (95.255 kg)  10/31/14 207 lb (93.895 kg)  09/28/14 206 lb 6 oz (93.611 kg)   BP Readings from Last 3 Encounters:  12/26/14 134/84  10/31/14 122/80  09/28/14 120/82      Review of Systems  Constitutional: Negative for appetite change, fatigue and unexpected weight change.  HENT: Positive for sore throat (1 day). Negative for congestion, nosebleeds, sneezing and trouble swallowing.   Eyes: Negative for itching and visual disturbance.  Respiratory: Negative for cough.   Cardiovascular: Negative for chest pain, palpitations and leg swelling.  Gastrointestinal: Negative for nausea, diarrhea, blood in stool and abdominal distention.  Genitourinary: Negative for frequency and hematuria.  Musculoskeletal: Negative for back pain, joint swelling, gait problem and neck pain.  Skin: Negative for rash.  Neurological: Negative for dizziness, tremors, speech difficulty and weakness.  Psychiatric/Behavioral: Negative for sleep disturbance, dysphoric mood and agitation. The patient is not nervous/anxious.        Objective:   Physical Exam  Constitutional: He is oriented to person, place, and time. He appears well-developed. No distress.  NAD  HENT:  Mouth/Throat: Oropharynx is clear and moist.  Eyes: Conjunctivae are normal. Pupils are equal, round, and reactive to light.  Neck: Normal range of motion. No JVD present. No thyromegaly present.  Cardiovascular: Normal rate, regular rhythm, normal heart sounds and intact distal pulses.  Exam reveals no gallop and no friction rub.   No murmur heard. Pulmonary/Chest: Effort normal and breath sounds  normal. No respiratory distress. He has no wheezes. He has no rales. He exhibits no tenderness.  Abdominal: Soft. Bowel sounds are normal. He exhibits no distension and no mass. There is no tenderness. There is no rebound and no guarding.  Musculoskeletal: Normal range of motion. He exhibits no edema or tenderness.  Lymphadenopathy:    He has no cervical adenopathy.  Neurological: He is alert and oriented to person, place, and time. He has normal reflexes. No cranial nerve deficit. He exhibits normal muscle tone. He displays a negative Romberg sign. Coordination and gait normal.  No meningeal signs  Skin: Skin is warm and dry. No rash noted.  Psychiatric: He has a normal mood and affect. His behavior is normal. Judgment and thought content normal.     Lab Results  Component Value Date   WBC 7.0 12/13/2013   HGB 15.4 12/13/2013   HCT 46.8 12/13/2013   PLT 250.0 12/13/2013   GLUCOSE 94 09/28/2014   CHOL 184 12/13/2013   TRIG 51.0 12/13/2013   HDL 55.20 12/13/2013   LDLDIRECT 126.5 06/14/2013   LDLCALC 119* 12/13/2013   ALT 19 12/13/2013   AST 18 12/13/2013   NA 138 09/28/2014   K 4.5 09/28/2014   CL 104 09/28/2014   CREATININE 1.0 09/28/2014   BUN 13 09/28/2014   CO2 31 09/28/2014   TSH 0.73 09/28/2014   PSA 1.46 06/22/2014   HGBA1C 6.4 06/22/2014   Abn UA    Assessment & Plan:

## 2014-12-26 NOTE — Assessment & Plan Note (Signed)
Continue with current prescription therapy as reflected on the Med list.  

## 2014-12-26 NOTE — Assessment & Plan Note (Signed)
Recurrent. He had an abd US in 2011 with Dr Kirtland BouchardK.; Dr Retta Dionesahlstedt 1/14

## 2014-12-26 NOTE — Progress Notes (Signed)
Pre visit review using our clinic review tool, if applicable. No additional management support is needed unless otherwise documented below in the visit note. 

## 2014-12-27 LAB — BASIC METABOLIC PANEL
BUN: 14 mg/dL (ref 6–23)
CHLORIDE: 102 meq/L (ref 96–112)
CO2: 28 meq/L (ref 19–32)
Calcium: 9.4 mg/dL (ref 8.4–10.5)
Creatinine, Ser: 1.1 mg/dL (ref 0.4–1.5)
GFR: 88.8 mL/min (ref >60.00)
GLUCOSE: 93 mg/dL (ref 70–99)
Potassium: 4.3 mEq/L (ref 3.5–5.1)
SODIUM: 137 meq/L (ref 135–145)

## 2014-12-28 ENCOUNTER — Encounter (HOSPITAL_COMMUNITY): Payer: Federal, State, Local not specified - PPO

## 2015-01-03 ENCOUNTER — Encounter: Payer: Self-pay | Admitting: Internal Medicine

## 2015-02-21 ENCOUNTER — Ambulatory Visit (AMBULATORY_SURGERY_CENTER): Payer: Self-pay

## 2015-02-21 VITALS — Ht 71.0 in | Wt 203.4 lb

## 2015-02-21 DIAGNOSIS — Z860100 Personal history of colon polyps, unspecified: Secondary | ICD-10-CM

## 2015-02-21 NOTE — Progress Notes (Signed)
No allergies to eggs or soy No past problems with anesthesia No home oxygen No diet/weight loss medss  No email

## 2015-03-07 ENCOUNTER — Encounter: Payer: Self-pay | Admitting: Internal Medicine

## 2015-03-07 ENCOUNTER — Ambulatory Visit (AMBULATORY_SURGERY_CENTER): Payer: Federal, State, Local not specified - PPO | Admitting: Internal Medicine

## 2015-03-07 VITALS — BP 129/65 | HR 83 | Temp 96.4°F | Resp 28 | Ht 71.0 in | Wt 203.0 lb

## 2015-03-07 DIAGNOSIS — Z86010 Personal history of colonic polyps: Secondary | ICD-10-CM

## 2015-03-07 MED ORDER — SODIUM CHLORIDE 0.9 % IV SOLN: 500.0000 mL | INTRAVENOUS | Status: DC

## 2015-03-07 NOTE — Patient Instructions (Signed)
YOU HAD AN ENDOSCOPIC PROCEDURE TODAY AT THE [facility]:   Refer to the procedure report that was given to you for any specific questions about what was found during the examination.  If the procedure report does not answer your questions, please call your gastroenterologist to clarify.  If you requested that your care partner not be given the details of your procedure findings, then the procedure report has been included in a sealed envelope for you to review at your convenience later.  YOU SHOULD EXPECT: Some feelings of bloating in the abdomen. Passage of more gas than usual.  Walking can help get rid of the air that was put into your GI tract during the procedure and reduce the bloating. If you had a lower endoscopy (such as a colonoscopy or flexible sigmoidoscopy) you may notice spotting of blood in your stool or on the toilet paper. If you underwent a bowel prep for your procedure, you may not have a normal bowel movement for a few days.  Please Note:  You might notice some irritation and congestion in your nose or some drainage.  This is from the oxygen used during your procedure.  There is no need for concern and it should clear up in a day or so.  SYMPTOMS TO REPORT IMMEDIATELY:   Following lower endoscopy (colonoscopy or flexible sigmoidoscopy):  Excessive amounts of blood in the stool  Significant tenderness or worsening of abdominal pains  Swelling of the abdomen that is new, acute  Fever of 100F or higher   For urgent or emergent issues, a gastroenterologist can be reached at any hour by calling [phone].   DIET: Your first meal following the procedure should be a small meal and then it is ok to progress to your normal diet. Heavy or fried foods are harder to digest and may make you feel nauseous or bloated.  Likewise, meals heavy in dairy and vegetables can increase bloating.  Drink plenty of fluids but you should avoid alcoholic beverages for 24  hours.  ACTIVITY:  You should plan to take it easy for the rest of today and you should NOT DRIVE or use heavy machinery until tomorrow (because of the sedation medicines used during the test).    FOLLOW UP: Our staff will call the number listed on your records the next business day following your procedure to check on you and address any questions or concerns that you may have regarding the information given to you following your procedure. If we do not reach you, we will leave a message.  However, if you are feeling well and you are not experiencing any problems, there is no need to return our call.  We will assume that you have returned to your regular daily activities without incident.  If any biopsies were taken you will be contacted by phone or by letter within the next 1-3 weeks.  Please call us at [phone] if you have not heard about the biopsies in 3 weeks.    SIGNATURES/CONFIDENTIALITY: You and/or your care partner have signed paperwork which will be entered into your electronic medical record.  These signatures attest to the fact that that the information above on your After Visit Summary has been reviewed and is understood.  Full responsibility of the confidentiality of this discharge information lies with you and/or your care-partner. 

## 2015-03-07 NOTE — Progress Notes (Signed)
Patient awakening,vss,report to rn 

## 2015-03-07 NOTE — Op Note (Signed)
[facility] [address] [place], [id number]   COLONOSCOPY PROCEDURE REPORT  PATIENT: [name], [name]  MR#: [id number] BIRTHDATE: 04/13/1951 , 63  yrs. old GENDER: male ENDOSCOPIST: Iva Booparl E Gessner, MD, [facility] PROCEDURE DATE:  03/07/2015 PROCEDURE:   Colonoscopy, surveillance First Screening Colonoscopy - Avg.  risk and is 50 yrs.  old or older - No.  Prior Negative Screening - Now for repeat screening. N/A  History of Adenoma - Now for follow-up colonoscopy & has been > or = to 3 yrs.  Yes hx of adenoma.  Has been 3 or more years since last colonoscopy. ASA CLASS:   Class II INDICATIONS:Surveillance due to prior colonic neoplasia and PH Colon Adenoma. MEDICATIONS: Propofol 300 mg IV and Monitored anesthesia care  DESCRIPTION OF PROCEDURE:   After the risks benefits and alternatives of the procedure were thoroughly explained, informed consent was obtained.  The digital rectal exam revealed no abnormalities of the rectum, revealed no prostatic nodules, and revealed the prostate was not enlarged.   The LB CF-H180AL Loaner [id number]  endoscope was introduced through the anus and advanced to the cecum, which was identified by both the appendix and ileocecal valve. No adverse events experienced.   The quality of the prep was good.  (MiraLax was used)  The instrument was then slowly withdrawn as the colon was fully examined.      COLON FINDINGS: A normal appearing cecum, ileocecal valve, and appendiceal orifice were identified.  The ascending, transverse, descending, sigmoid colon, and rectum appeared unremarkable. Retroflexed views revealed no abnormalities. The time to cecum = 3.1 Withdrawal time = 14.0   The scope was withdrawn and the procedure completed. COMPLICATIONS: There were no immediate complications.  ENDOSCOPIC IMPRESSION: Normal colonoscopy - good prep  RECOMMENDATIONS: Repeat Colonoscopy in 7 years.  (2003 - 9 mm adenoma and possible other adenomas  (max 3) with 4 diminutive polyps destroyed 2005 no polyps 2010 4 mm distal hyperplastic polyp)  eSigned:  Iva Booparl E Gessner, MD, [facility] 03/07/2015 11:54 AM   cc: The Patient

## 2015-03-08 ENCOUNTER — Telehealth: Payer: Self-pay

## 2015-03-08 NOTE — Telephone Encounter (Signed)
  Follow up Call-  Call back number 03/07/2015  Post procedure Call Back phone  # [phone]  Permission to leave phone message Yes     Patient questions:  Do you have a fever, pain , or abdominal swelling? No. Pain Score  0 *  Have you tolerated food without any problems? Yes.    Have you been able to return to your normal activities? Yes.    Do you have any questions about your discharge instructions: Diet   No. Medications  No. Follow up visit  No.  Do you have questions or concerns about your Care? No.  Actions: * If pain score is 4 or above: No action needed, pain <4.

## 2015-05-09 ENCOUNTER — Encounter: Payer: Self-pay | Admitting: Internal Medicine

## 2015-06-26 ENCOUNTER — Encounter: Payer: Self-pay | Admitting: Internal Medicine

## 2015-06-26 ENCOUNTER — Ambulatory Visit (INDEPENDENT_AMBULATORY_CARE_PROVIDER_SITE_OTHER): Payer: Federal, State, Local not specified - PPO | Admitting: Internal Medicine

## 2015-06-26 ENCOUNTER — Other Ambulatory Visit (INDEPENDENT_AMBULATORY_CARE_PROVIDER_SITE_OTHER): Payer: Federal, State, Local not specified - PPO

## 2015-06-26 VITALS — BP 118/70 | HR 108 | Wt 211.0 lb

## 2015-06-26 DIAGNOSIS — N400 Enlarged prostate without lower urinary tract symptoms: Secondary | ICD-10-CM | POA: Diagnosis not present

## 2015-06-26 DIAGNOSIS — Z0000 Encounter for general adult medical examination without abnormal findings: Secondary | ICD-10-CM

## 2015-06-26 DIAGNOSIS — E119 Type 2 diabetes mellitus without complications: Secondary | ICD-10-CM

## 2015-06-26 LAB — BASIC METABOLIC PANEL
BUN: 13 mg/dL (ref 6–23)
CO2: 26 mEq/L (ref 19–32)
Calcium: 9.5 mg/dL (ref 8.4–10.5)
Chloride: 103 mEq/L (ref 96–112)
Creatinine, Ser: 0.96 mg/dL (ref 0.40–1.50)
GFR: 101.57 mL/min (ref >60.00)
GLUCOSE: 162 mg/dL — AB (ref 70–99)
POTASSIUM: 4.1 meq/L (ref 3.5–5.1)
SODIUM: 138 meq/L (ref 135–145)

## 2015-06-26 LAB — URINALYSIS, ROUTINE W REFLEX MICROSCOPIC
BILIRUBIN URINE: NEGATIVE
KETONES UR: NEGATIVE
Nitrite: NEGATIVE
PH: 5.5 (ref 5.0–8.0)
Specific Gravity, Urine: >=1.03 — AB (ref 1.000–1.030)
Total Protein, Urine: NEGATIVE
Urine Glucose: 250 — AB
Urobilinogen, UA: 0.2 (ref 0.0–1.0)

## 2015-06-26 LAB — CBC WITH DIFFERENTIAL/PLATELET
BASOS ABS: 0 10*3/uL (ref 0.0–0.1)
Basophils Relative: 0.6 % (ref 0.0–3.0)
EOS PCT: 2.4 % (ref 0.0–5.0)
Eosinophils Absolute: 0.2 10*3/uL (ref 0.0–0.7)
HCT: 46.6 % (ref 39.0–52.0)
Hemoglobin: 15.8 g/dL (ref 13.0–17.0)
Lymphocytes Relative: 28.5 % (ref 12.0–46.0)
Lymphs Abs: 2.1 10*3/uL (ref 0.7–4.0)
MCHC: 34 g/dL (ref 30.0–36.0)
MCV: 93.5 fl (ref 78.0–100.0)
MONO ABS: 0.6 10*3/uL (ref 0.1–1.0)
Monocytes Relative: 8.5 % (ref 3.0–12.0)
Neutro Abs: 4.5 10*3/uL (ref 1.4–7.7)
Neutrophils Relative %: 60 % (ref 43.0–77.0)
PLATELETS: 182 10*3/uL (ref 150.0–400.0)
RBC: 4.98 Mil/uL (ref 4.22–5.81)
RDW: 14.1 % (ref 11.5–15.5)
WBC: 7.5 10*3/uL (ref 4.0–10.5)

## 2015-06-26 LAB — HEPATIC FUNCTION PANEL
ALK PHOS: 58 U/L (ref 39–117)
ALT: 16 U/L (ref 0–53)
AST: 15 U/L (ref 0–37)
Albumin: 4.3 g/dL (ref 3.5–5.2)
Bilirubin, Direct: 0.1 mg/dL (ref 0.0–0.3)
TOTAL PROTEIN: 7.4 g/dL (ref 6.0–8.3)
Total Bilirubin: 0.6 mg/dL (ref 0.2–1.2)

## 2015-06-26 LAB — HEMOGLOBIN A1C: HEMOGLOBIN A1C: 6.5 % (ref 4.6–6.5)

## 2015-06-26 LAB — PSA: PSA: 1.06 ng/mL (ref 0.10–4.00)

## 2015-06-26 LAB — LIPID PANEL
Cholesterol: 195 mg/dL (ref 0–200)
HDL: 54.2 mg/dL (ref >39.00)
LDL CALC: 112 mg/dL — AB (ref 0–99)
NONHDL: 140.8
Total CHOL/HDL Ratio: 4
Triglycerides: 144 mg/dL (ref 0.0–149.0)
VLDL: 28.8 mg/dL (ref 0.0–40.0)

## 2015-06-26 LAB — TSH: TSH: 0.66 u[IU]/mL (ref 0.35–4.50)

## 2015-06-26 NOTE — Assessment & Plan Note (Signed)
No sx's 

## 2015-06-26 NOTE — Progress Notes (Signed)
Subjective:  Patient ID: [name], male    DOB: [birth year]  Age: 64 y.o. MRN: [id number]  CC: No chief complaint on file.   HPI [name] presents for DM2, UTI  Outpatient Prescriptions Prior to Visit  Medication Sig Dispense Refill  . aspirin 81 MG EC tablet Take 81 mg by mouth daily.      . cholecalciferol (VITAMIN D) 1000 UNITS tablet Take 1,000 Units by mouth daily.      . metFORMIN (GLUCOPHAGE) 500 MG tablet TAKE 1 TABLET BY MOUTH EVERY MORNING WITH BREAKFAST 90 tablet 3  . Omega-3 Fatty Acids (FISH OIL) 1000 MG CAPS Take 1 capsule by mouth 2 (two) times daily.       No facility-administered medications prior to visit.    ROS Review of Systems  Constitutional: Negative for appetite change, fatigue and unexpected weight change.  HENT: Negative for congestion, nosebleeds, sneezing, sore throat and trouble swallowing.   Eyes: Negative for itching and visual disturbance.  Respiratory: Negative for cough.   Cardiovascular: Negative for chest pain, palpitations and leg swelling.  Gastrointestinal: Negative for nausea, diarrhea, blood in stool and abdominal distention.  Genitourinary: Negative for frequency and hematuria.  Musculoskeletal: Negative for back pain, joint swelling, gait problem and neck pain.  Skin: Negative for rash.  Neurological: Negative for dizziness, tremors, speech difficulty and weakness.  Psychiatric/Behavioral: Negative for sleep disturbance, dysphoric mood and agitation. The patient is not nervous/anxious.     Objective:  BP 118/70 mmHg  Pulse 108  Wt 211 lb (95.709 kg)  SpO2 97%  BP Readings from Last 3 Encounters:  06/26/15 118/70  03/07/15 129/65  12/26/14 134/84    Wt Readings from Last 3 Encounters:  06/26/15 211 lb (95.709 kg)  03/07/15 203 lb (92.08 kg)  02/21/15 203 lb 6.4 oz (92.262 kg)    Physical Exam  Constitutional: He is oriented to person, place, and time. He appears well-developed. No distress.  NAD  HENT:    Mouth/Throat: Oropharynx is clear and moist.  Eyes: Conjunctivae are normal. Pupils are equal, round, and reactive to light.  Neck: Normal range of motion. No JVD present. No thyromegaly present.  Cardiovascular: Normal rate, regular rhythm, normal heart sounds and intact distal pulses.  Exam reveals no gallop and no friction rub.   No murmur heard. Pulmonary/Chest: Effort normal and breath sounds normal. No respiratory distress. He has no wheezes. He has no rales. He exhibits no tenderness.  Abdominal: Soft. Bowel sounds are normal. He exhibits no distension and no mass. There is no tenderness. There is no rebound and no guarding.  Musculoskeletal: Normal range of motion. He exhibits no edema or tenderness.  Lymphadenopathy:    He has no cervical adenopathy.  Neurological: He is alert and oriented to person, place, and time. He has normal reflexes. No cranial nerve deficit. He exhibits normal muscle tone. He displays a negative Romberg sign. Coordination and gait normal.  Skin: Skin is warm and dry. No rash noted.  Psychiatric: He has a normal mood and affect. His behavior is normal. Judgment and thought content normal.    Lab Results  Component Value Date   WBC 7.5 06/26/2015   HGB 15.8 06/26/2015   HCT 46.6 06/26/2015   PLT 182.0 06/26/2015   GLUCOSE 162* 06/26/2015   CHOL 195 06/26/2015   TRIG 144.0 06/26/2015   HDL 54.20 06/26/2015   LDLDIRECT 126.5 06/14/2013   LDLCALC 112* 06/26/2015   ALT 16 06/26/2015   AST 15 06/26/2015  NA 138 06/26/2015   K 4.1 06/26/2015   CL 103 06/26/2015   CREATININE 0.96 06/26/2015   BUN 13 06/26/2015   CO2 26 06/26/2015   TSH 0.66 06/26/2015   PSA 1.06 06/26/2015   HGBA1C 6.5 06/26/2015    Dg Chest 2 View  01/26/2013   *RADIOLOGY REPORT*  Clinical Data: Acute bronchitis, cough.  CHEST - 2 VIEW  Comparison: None.  Findings: Cardiomediastinal silhouette appears normal.  No acute pulmonary disease is noted.  Bony thorax is intact.   IMPRESSION: No acute cardiopulmonary abnormality seen.   Original Report Authenticated By: Lupita RaiderJames Green Jr.,  M.D.    Assessment & Plan:   Diagnoses and all orders for this visit:  Type 2 diabetes mellitus without complication Orders: -     Hemoglobin A1c; Future  BPH (benign prostatic hyperplasia)  Well adult exam Orders: -     CBC with Differential/Platelet; Future -     Basic metabolic panel; Future -     Hepatic function panel; Future -     Lipid panel; Future -     PSA; Future -     TSH; Future -     Urinalysis; Future  I am having Mr. Kniskern maintain his aspirin, Fish Oil, cholecalciferol, and metFORMIN.  No orders of the defined types were placed in this encounter.     Follow-up: Return in about 6 months (around 12/27/2015) for a follow-up visit.  Sonda PrimesAlex Ahmiya Abee, MD

## 2015-06-26 NOTE — Progress Notes (Signed)
Pre visit review using our clinic review tool, if applicable. No additional management support is needed unless otherwise documented below in the visit note. 

## 2015-06-26 NOTE — Assessment & Plan Note (Signed)
1/14 DM2 On Metformin Labs

## 2015-06-26 NOTE — Assessment & Plan Note (Signed)
Not on Rx 

## 2015-07-04 ENCOUNTER — Ambulatory Visit (INDEPENDENT_AMBULATORY_CARE_PROVIDER_SITE_OTHER): Payer: Federal, State, Local not specified - PPO | Admitting: Geriatric Medicine

## 2015-07-04 DIAGNOSIS — Z418 Encounter for other procedures for purposes other than remedying health state: Secondary | ICD-10-CM

## 2015-07-04 DIAGNOSIS — Z23 Encounter for immunization: Secondary | ICD-10-CM | POA: Diagnosis not present

## 2015-07-04 DIAGNOSIS — Z299 Encounter for prophylactic measures, unspecified: Secondary | ICD-10-CM

## 2015-09-21 ENCOUNTER — Ambulatory Visit (INDEPENDENT_AMBULATORY_CARE_PROVIDER_SITE_OTHER): Payer: Federal, State, Local not specified - PPO

## 2015-09-21 DIAGNOSIS — Z23 Encounter for immunization: Secondary | ICD-10-CM | POA: Diagnosis not present

## 2015-12-20 ENCOUNTER — Other Ambulatory Visit: Payer: Self-pay | Admitting: Internal Medicine

## 2015-12-27 ENCOUNTER — Ambulatory Visit: Payer: Federal, State, Local not specified - PPO | Admitting: Internal Medicine

## 2016-01-01 ENCOUNTER — Ambulatory Visit: Payer: Federal, State, Local not specified - PPO | Admitting: Internal Medicine

## 2016-01-02 ENCOUNTER — Other Ambulatory Visit (INDEPENDENT_AMBULATORY_CARE_PROVIDER_SITE_OTHER): Payer: Federal, State, Local not specified - PPO

## 2016-01-02 ENCOUNTER — Ambulatory Visit (INDEPENDENT_AMBULATORY_CARE_PROVIDER_SITE_OTHER): Payer: Federal, State, Local not specified - PPO | Admitting: Internal Medicine

## 2016-01-02 ENCOUNTER — Encounter: Payer: Self-pay | Admitting: Internal Medicine

## 2016-01-02 VITALS — BP 120/84 | HR 88 | Wt 216.0 lb

## 2016-01-02 DIAGNOSIS — E119 Type 2 diabetes mellitus without complications: Secondary | ICD-10-CM

## 2016-01-02 LAB — BASIC METABOLIC PANEL
BUN: 15 mg/dL (ref 6–23)
CHLORIDE: 101 meq/L (ref 96–112)
CO2: 28 mEq/L (ref 19–32)
Calcium: 9.4 mg/dL (ref 8.4–10.5)
Creatinine, Ser: 0.94 mg/dL (ref 0.40–1.50)
GFR: 103.89 mL/min (ref >60.00)
Glucose, Bld: 106 mg/dL — ABNORMAL HIGH (ref 70–99)
POTASSIUM: 4.1 meq/L (ref 3.5–5.1)
SODIUM: 138 meq/L (ref 135–145)

## 2016-01-02 LAB — HEMOGLOBIN A1C: HEMOGLOBIN A1C: 7.2 % — AB (ref 4.6–6.5)

## 2016-01-02 NOTE — Assessment & Plan Note (Addendum)
On Metformin Labs 

## 2016-01-02 NOTE — Progress Notes (Signed)
Pre visit review using our clinic review tool, if applicable. No additional management support is needed unless otherwise documented below in the visit note. 

## 2016-01-02 NOTE — Progress Notes (Signed)
Subjective:  Patient ID: [name], male    DOB: 1951-07-25  Age: 65 y.o. MRN: [id number]  CC: No chief complaint on file.   HPI Devlin Komperda presents for DM2 f/u  Outpatient Prescriptions Prior to Visit  Medication Sig Dispense Refill  . aspirin 81 MG EC tablet Take 81 mg by mouth daily.      . cholecalciferol (VITAMIN D) 1000 UNITS tablet Take 1,000 Units by mouth daily.      . metFORMIN (GLUCOPHAGE) 500 MG tablet TAKE 1 TABLET BY MOUTH EVERY MORNING WITH BREAKFAST 90 tablet 3  . Omega-3 Fatty Acids (FISH OIL) 1000 MG CAPS Take 1 capsule by mouth 2 (two) times daily.       No facility-administered medications prior to visit.    ROS Review of Systems  Constitutional: Negative for appetite change, fatigue and unexpected weight change.  HENT: Negative for congestion, nosebleeds, sneezing, sore throat and trouble swallowing.   Eyes: Negative for itching and visual disturbance.  Respiratory: Negative for cough.   Cardiovascular: Negative for chest pain, palpitations and leg swelling.  Gastrointestinal: Negative for nausea, diarrhea, blood in stool and abdominal distention.  Genitourinary: Negative for frequency and hematuria.  Musculoskeletal: Negative for back pain, joint swelling, gait problem and neck pain.  Skin: Negative for rash.  Neurological: Negative for dizziness, tremors, speech difficulty and weakness.  Psychiatric/Behavioral: Negative for sleep disturbance, dysphoric mood and agitation. The patient is not nervous/anxious.     Objective:  BP 120/84 mmHg  Pulse 88  Wt 216 lb (97.977 kg)  SpO2 98%  BP Readings from Last 3 Encounters:  01/02/16 120/84  06/26/15 118/70  03/07/15 129/65    Wt Readings from Last 3 Encounters:  01/02/16 216 lb (97.977 kg)  06/26/15 211 lb (95.709 kg)  03/07/15 203 lb (92.08 kg)    Physical Exam  Constitutional: He is oriented to person, place, and time. He appears well-developed. No distress.  NAD  HENT:  Mouth/Throat:  Oropharynx is clear and moist.  Eyes: Conjunctivae are normal. Pupils are equal, round, and reactive to light.  Neck: Normal range of motion. No JVD present. No thyromegaly present.  Cardiovascular: Normal rate, regular rhythm, normal heart sounds and intact distal pulses.  Exam reveals no gallop and no friction rub.   No murmur heard. Pulmonary/Chest: Effort normal and breath sounds normal. No respiratory distress. He has no wheezes. He has no rales. He exhibits no tenderness.  Abdominal: Soft. Bowel sounds are normal. He exhibits no distension and no mass. There is no tenderness. There is no rebound and no guarding.  Musculoskeletal: Normal range of motion. He exhibits no edema or tenderness.  Lymphadenopathy:    He has no cervical adenopathy.  Neurological: He is alert and oriented to person, place, and time. He has normal reflexes. No cranial nerve deficit. He exhibits normal muscle tone. He displays a negative Romberg sign. Coordination and gait normal.  Skin: Skin is warm and dry. No rash noted.  Psychiatric: He has a normal mood and affect. His behavior is normal. Judgment and thought content normal.    Lab Results  Component Value Date   WBC 7.5 06/26/2015   HGB 15.8 06/26/2015   HCT 46.6 06/26/2015   PLT 182.0 06/26/2015   GLUCOSE 162* 06/26/2015   CHOL 195 06/26/2015   TRIG 144.0 06/26/2015   HDL 54.20 06/26/2015   LDLDIRECT 126.5 06/14/2013   LDLCALC 112* 06/26/2015   ALT 16 06/26/2015   AST 15 06/26/2015   NA  138 06/26/2015   K 4.1 06/26/2015   CL 103 06/26/2015   CREATININE 0.96 06/26/2015   BUN 13 06/26/2015   CO2 26 06/26/2015   TSH 0.66 06/26/2015   PSA 1.06 06/26/2015   HGBA1C 6.5 06/26/2015    Dg Chest 2 View  01/26/2013  *RADIOLOGY REPORT* Clinical Data: Acute bronchitis, cough. CHEST - 2 VIEW Comparison: None. Findings: Cardiomediastinal silhouette appears normal.  No acute pulmonary disease is noted.  Bony thorax is intact. IMPRESSION: No acute  cardiopulmonary abnormality seen. Original Report Authenticated By: Lupita Raider.,  M.D.    Assessment & Plan:   There are no diagnoses linked to this encounter. I am having Mr. Noguera maintain his aspirin, Fish Oil, cholecalciferol, and metFORMIN.  No orders of the defined types were placed in this encounter.     Follow-up: No Follow-up on file.  Sonda Primes, MD

## 2016-02-17 ENCOUNTER — Ambulatory Visit (INDEPENDENT_AMBULATORY_CARE_PROVIDER_SITE_OTHER): Payer: Federal, State, Local not specified - PPO | Admitting: Internal Medicine

## 2016-02-17 VITALS — BP 136/84 | HR 92 | Temp 98.4°F | Ht 71.0 in | Wt 212.5 lb

## 2016-02-17 DIAGNOSIS — J309 Allergic rhinitis, unspecified: Secondary | ICD-10-CM

## 2016-02-17 MED ORDER — OLOPATADINE HCL 0.2 % OP SOLN: 1.0000 [drp] | Freq: Every day | OPHTHALMIC | Status: DC

## 2016-02-17 MED ORDER — LORATADINE 10 MG PO TABS: 10.0000 mg | ORAL_TABLET | Freq: Every day | ORAL | Status: DC

## 2016-02-17 NOTE — Progress Notes (Signed)
Pre visit review using our clinic review tool, if applicable. No additional management support is needed unless otherwise documented below in the visit note. 

## 2016-02-17 NOTE — Patient Instructions (Signed)
Start Clartitin 10mg  daily.  Start Pataday 1-2 drops both eyes daily.  We will set up general eye exam.

## 2016-02-17 NOTE — Assessment & Plan Note (Signed)
Symptoms and exam most c/w allergic rhinitis. Will start Pataday and Claritin. Follow up prn. Will also set up yearly eye exam.

## 2016-02-17 NOTE — Progress Notes (Signed)
Subjective:    Patient ID: [name], male    DOB: 01-13-1951, 65 y.o.   MRN: [id number]  HPI  65YO male presents for acute visit.  Bilateral eye itching - In morning both eyes are red. Then right eye redness persists. Not painful. Light doesn't bother him. No known injury.No drainage noted. No visual changes. Some allergy symptoms with congestion, sneezing. Using Visine with no improvement. No pets in home. Last eye exam a few years ago.  Wt Readings from Last 3 Encounters:  02/17/16 212 lb 8 oz (96.389 kg)  01/02/16 216 lb (97.977 kg)  06/26/15 211 lb (95.709 kg)   BP Readings from Last 3 Encounters:  02/17/16 136/84  01/02/16 120/84  06/26/15 118/70    Past Medical History  Diagnosis Date  . Hx of colonic polyp   . BPH (benign prostatic hypertrophy)     nodularity- Dr. [place] q 1 year  . Microscopic hematuria     hx of  . Diabetes mellitus type II   . Hx of adenomatous colonic polyps 10/07/2002   Family History  Problem Relation Age of Onset  . Heart disease Father [id number]    No details  . Hypertension Mother   . Kidney disease Mother     ESRD  . Colon cancer Neg Hx    Past Surgical History  Procedure Laterality Date  . Cholecystectomy    . Colonoscopy w/ polypectomy     Social History   Social History  . Marital Status: Legally Separated    Spouse Name: N/A  . Number of Children: N/A  . Years of Education: N/A   Social History Main Topics  . Smoking status: Former Smoker    Quit date: 08/28/2002  . Smokeless tobacco: Never Used  . Alcohol Use: No  . Drug Use: No  . Sexual Activity: Yes   Other Topics Concern  . Not on file   Social History Narrative   Lives alone.  One daughter.  Works in the post office.      Review of Systems  Constitutional: Negative for fever, chills, activity change and fatigue.  HENT: Positive for congestion and sneezing. Negative for ear discharge, ear pain, hearing loss, nosebleeds, postnasal drip, rhinorrhea,  sinus pressure, sore throat, tinnitus, trouble swallowing and voice change.   Eyes: Positive for redness. Negative for photophobia, discharge, itching and visual disturbance.  Respiratory: Negative for cough, chest tightness, shortness of breath, wheezing and stridor.   Cardiovascular: Negative for chest pain and leg swelling.  Musculoskeletal: Negative for myalgias, arthralgias, neck pain and neck stiffness.  Skin: Negative for color change and rash.  Neurological: Negative for dizziness, facial asymmetry and headaches.  Psychiatric/Behavioral: Negative for sleep disturbance.       Objective:    BP 136/84 mmHg  Pulse 92  Temp(Src) 98.4 F (36.9 C) (Oral)  Ht 5\' 11"  (1.803 m)  Wt 212 lb 8 oz (96.389 kg)  BMI 29.65 kg/m2  SpO2 97% Physical Exam  Constitutional: He appears well-developed and well-nourished.  HENT:  Head: Normocephalic.  Eyes: EOM and lids are normal. Pupils are equal, round, and reactive to light. Lids are everted and swept, no foreign bodies found. Right eye exhibits no discharge and no exudate. No foreign body present in the right eye. Left eye exhibits no discharge and no exudate. No foreign body present in the left eye. Right conjunctiva is injected.  Pulmonary/Chest: No accessory muscle usage. No tachypnea. No respiratory distress.  Skin: He is not diaphoretic.  Assessment & Plan:   Problem List Items Addressed This Visit      Unprioritized   Rhinitis, allergic - Primary    Symptoms and exam most c/w allergic rhinitis. Will start Pataday and Claritin. Follow up prn. Will also set up yearly eye exam.      Relevant Medications   Olopatadine HCl (PATADAY) 0.2 % SOLN   loratadine (CLARITIN) 10 MG tablet   Other Relevant Orders   Ambulatory referral to Ophthalmology       Return if symptoms worsen or fail to improve.  Ronna Polio, MD Internal Medicine [facility]

## 2016-07-01 ENCOUNTER — Other Ambulatory Visit (INDEPENDENT_AMBULATORY_CARE_PROVIDER_SITE_OTHER): Payer: Federal, State, Local not specified - PPO

## 2016-07-01 ENCOUNTER — Ambulatory Visit: Payer: Federal, State, Local not specified - PPO | Admitting: Internal Medicine

## 2016-07-01 ENCOUNTER — Encounter: Payer: Self-pay | Admitting: Internal Medicine

## 2016-07-01 ENCOUNTER — Ambulatory Visit (INDEPENDENT_AMBULATORY_CARE_PROVIDER_SITE_OTHER): Payer: Federal, State, Local not specified - PPO | Admitting: Internal Medicine

## 2016-07-01 VITALS — BP 120/80 | HR 88 | Wt 208.0 lb

## 2016-07-01 DIAGNOSIS — R030 Elevated blood-pressure reading, without diagnosis of hypertension: Secondary | ICD-10-CM

## 2016-07-01 DIAGNOSIS — E119 Type 2 diabetes mellitus without complications: Secondary | ICD-10-CM

## 2016-07-01 DIAGNOSIS — IMO0001 Reserved for inherently not codable concepts without codable children: Secondary | ICD-10-CM

## 2016-07-01 DIAGNOSIS — N390 Urinary tract infection, site not specified: Secondary | ICD-10-CM

## 2016-07-01 LAB — PSA: PSA: 1.24 ng/mL (ref 0.10–4.00)

## 2016-07-01 LAB — BASIC METABOLIC PANEL
BUN: 21 mg/dL (ref 6–23)
CO2: 30 meq/L (ref 19–32)
CREATININE: 1.05 mg/dL (ref 0.40–1.50)
Calcium: 10 mg/dL (ref 8.4–10.5)
Chloride: 103 mEq/L (ref 96–112)
GFR: 91.29 mL/min (ref >60.00)
GLUCOSE: 112 mg/dL — AB (ref 70–99)
Potassium: 4.6 mEq/L (ref 3.5–5.1)
Sodium: 139 mEq/L (ref 135–145)

## 2016-07-01 LAB — HEMOGLOBIN A1C: Hgb A1c MFr Bld: 6.8 % — ABNORMAL HIGH (ref 4.6–6.5)

## 2016-07-01 LAB — TSH: TSH: 1.16 u[IU]/mL (ref 0.35–4.50)

## 2016-07-01 NOTE — Assessment & Plan Note (Signed)
1/14 DM2 On Metformin

## 2016-07-01 NOTE — Progress Notes (Signed)
Subjective:  Patient ID: [name], male    DOB: 03/09/1951  Age: 65 y.o. MRN: [id number]  CC: No chief complaint on file.   HPI [name] presents for 6 mo f/u of DM, UTI, elevated BP   Outpatient Prescriptions Prior to Visit  Medication Sig Dispense Refill  . aspirin 81 MG EC tablet Take 81 mg by mouth daily.      . cholecalciferol (VITAMIN D) 1000 UNITS tablet Take 1,000 Units by mouth daily.      Marland Kitchen loratadine (CLARITIN) 10 MG tablet Take 1 tablet (10 mg total) by mouth daily. 30 tablet 11  . metFORMIN (GLUCOPHAGE) 500 MG tablet TAKE 1 TABLET BY MOUTH EVERY MORNING WITH BREAKFAST 90 tablet 3  . Olopatadine HCl (PATADAY) 0.2 % SOLN Apply 1 drop to eye daily. 2.5 mL 0  . Omega-3 Fatty Acids (FISH OIL) 1000 MG CAPS Take 1 capsule by mouth 2 (two) times daily.       No facility-administered medications prior to visit.    ROS Review of Systems  Constitutional: Negative for appetite change, fatigue and unexpected weight change.  HENT: Negative for congestion, nosebleeds, sneezing, sore throat and trouble swallowing.   Eyes: Negative for itching and visual disturbance.  Respiratory: Negative for cough.   Cardiovascular: Negative for chest pain, palpitations and leg swelling.  Gastrointestinal: Negative for nausea, diarrhea, blood in stool and abdominal distention.  Genitourinary: Negative for frequency and hematuria.  Musculoskeletal: Negative for back pain, joint swelling, gait problem and neck pain.  Skin: Negative for rash.  Neurological: Negative for dizziness, tremors, speech difficulty and weakness.  Psychiatric/Behavioral: Negative for sleep disturbance, dysphoric mood and agitation. The patient is not nervous/anxious.     Objective:  BP 120/80 mmHg  Pulse 88  Wt 208 lb (94.348 kg)  SpO2 97%  BP Readings from Last 3 Encounters:  07/01/16 120/80  02/17/16 136/84  01/02/16 120/84    Wt Readings from Last 3 Encounters:  07/01/16 208 lb (94.348 kg)  02/17/16  212 lb 8 oz (96.389 kg)  01/02/16 216 lb (97.977 kg)    Physical Exam  Constitutional: He is oriented to person, place, and time. He appears well-developed. No distress.  NAD  HENT:  Mouth/Throat: Oropharynx is clear and moist.  Eyes: Conjunctivae are normal. Pupils are equal, round, and reactive to light.  Neck: Normal range of motion. No JVD present. No thyromegaly present.  Cardiovascular: Normal rate, regular rhythm, normal heart sounds and intact distal pulses.  Exam reveals no gallop and no friction rub.   No murmur heard. Pulmonary/Chest: Effort normal and breath sounds normal. No respiratory distress. He has no wheezes. He has no rales. He exhibits no tenderness.  Abdominal: Soft. Bowel sounds are normal. He exhibits no distension and no mass. There is no tenderness. There is no rebound and no guarding.  Musculoskeletal: Normal range of motion. He exhibits no edema or tenderness.  Lymphadenopathy:    He has no cervical adenopathy.  Neurological: He is alert and oriented to person, place, and time. He has normal reflexes. No cranial nerve deficit. He exhibits normal muscle tone. He displays a negative Romberg sign. Coordination and gait normal.  Skin: Skin is warm and dry. No rash noted.  Psychiatric: He has a normal mood and affect. His behavior is normal. Judgment and thought content normal.    Lab Results  Component Value Date   WBC 7.5 06/26/2015   HGB 15.8 06/26/2015   HCT 46.6 06/26/2015   PLT  182.0 06/26/2015   GLUCOSE 106* 01/02/2016   CHOL 195 06/26/2015   TRIG 144.0 06/26/2015   HDL 54.20 06/26/2015   LDLDIRECT 126.5 06/14/2013   LDLCALC 112* 06/26/2015   ALT 16 06/26/2015   AST 15 06/26/2015   NA 138 01/02/2016   K 4.1 01/02/2016   CL 101 01/02/2016   CREATININE 0.94 01/02/2016   BUN 15 01/02/2016   CO2 28 01/02/2016   TSH 0.66 06/26/2015   PSA 1.06 06/26/2015   HGBA1C 7.2* 01/02/2016    Dg Chest 2 View  01/26/2013  *RADIOLOGY REPORT* Clinical Data:  Acute bronchitis, cough. CHEST - 2 VIEW Comparison: None. Findings: Cardiomediastinal silhouette appears normal.  No acute pulmonary disease is noted.  Bony thorax is intact. IMPRESSION: No acute cardiopulmonary abnormality seen. Original Report Authenticated By: Lupita RaiderJames Green Jr.,  M.D.    Assessment & Plan:   There are no diagnoses linked to this encounter. I am having [name] maintain his aspirin, Fish Oil, cholecalciferol, metFORMIN, Olopatadine HCl, and loratadine.  No orders of the defined types were placed in this encounter.     Follow-up: No Follow-up on file.  Sonda PrimesAlex Plotnikov, MD

## 2016-07-01 NOTE — Progress Notes (Signed)
Pre visit review using our clinic review tool, if applicable. No additional management support is needed unless otherwise documented below in the visit note. 

## 2016-07-01 NOTE — Assessment & Plan Note (Signed)
NAS diet 

## 2016-07-01 NOTE — Assessment & Plan Note (Signed)
UA prn 

## 2016-07-02 LAB — CBC WITH DIFFERENTIAL/PLATELET
BASOS ABS: 0 10*3/uL (ref 0.0–0.1)
Basophils Relative: 0.5 % (ref 0.0–3.0)
EOS ABS: 0.3 10*3/uL (ref 0.0–0.7)
Eosinophils Relative: 2.9 % (ref 0.0–5.0)
HEMATOCRIT: 46.5 % (ref 39.0–52.0)
Hemoglobin: 15.7 g/dL (ref 13.0–17.0)
LYMPHS PCT: 37.2 % (ref 12.0–46.0)
Lymphs Abs: 3.3 10*3/uL (ref 0.7–4.0)
MCHC: 33.7 g/dL (ref 30.0–36.0)
MCV: 93.7 fl (ref 78.0–100.0)
Monocytes Absolute: 0.7 10*3/uL (ref 0.1–1.0)
Monocytes Relative: 7.6 % (ref 3.0–12.0)
NEUTROS ABS: 4.6 10*3/uL (ref 1.4–7.7)
Neutrophils Relative %: 51.8 % (ref 43.0–77.0)
Platelets: 206 10*3/uL (ref 150.0–400.0)
RBC: 4.97 Mil/uL (ref 4.22–5.81)
RDW: 13.9 % (ref 11.5–15.5)
WBC: 8.9 10*3/uL (ref 4.0–10.5)

## 2016-09-20 ENCOUNTER — Ambulatory Visit (INDEPENDENT_AMBULATORY_CARE_PROVIDER_SITE_OTHER): Payer: Federal, State, Local not specified - PPO | Admitting: Physician Assistant

## 2016-09-20 DIAGNOSIS — Z23 Encounter for immunization: Secondary | ICD-10-CM

## 2016-12-05 ENCOUNTER — Other Ambulatory Visit: Payer: Self-pay | Admitting: Internal Medicine

## 2016-12-30 ENCOUNTER — Other Ambulatory Visit (INDEPENDENT_AMBULATORY_CARE_PROVIDER_SITE_OTHER): Payer: Medicare Other

## 2016-12-30 ENCOUNTER — Ambulatory Visit (INDEPENDENT_AMBULATORY_CARE_PROVIDER_SITE_OTHER): Payer: Medicare Other | Admitting: Internal Medicine

## 2016-12-30 DIAGNOSIS — J040 Acute laryngitis: Secondary | ICD-10-CM | POA: Insufficient documentation

## 2016-12-30 DIAGNOSIS — E119 Type 2 diabetes mellitus without complications: Secondary | ICD-10-CM

## 2016-12-30 LAB — BASIC METABOLIC PANEL
BUN: 16 mg/dL (ref 6–23)
CALCIUM: 9.9 mg/dL (ref 8.4–10.5)
CO2: 28 meq/L (ref 19–32)
Chloride: 102 mEq/L (ref 96–112)
Creatinine, Ser: 0.89 mg/dL (ref 0.40–1.50)
GFR: 110.31 mL/min (ref >60.00)
Glucose, Bld: 103 mg/dL — ABNORMAL HIGH (ref 70–99)
Potassium: 4.6 mEq/L (ref 3.5–5.1)
SODIUM: 138 meq/L (ref 135–145)

## 2016-12-30 LAB — HEPATIC FUNCTION PANEL
ALT: 16 U/L (ref 0–53)
AST: 11 U/L (ref 0–37)
Albumin: 4.6 g/dL (ref 3.5–5.2)
Alkaline Phosphatase: 59 U/L (ref 39–117)
BILIRUBIN DIRECT: 0.1 mg/dL (ref 0.0–0.3)
BILIRUBIN TOTAL: 0.4 mg/dL (ref 0.2–1.2)
Total Protein: 8 g/dL (ref 6.0–8.3)

## 2016-12-30 LAB — HEMOGLOBIN A1C: HEMOGLOBIN A1C: 7.4 % — AB (ref 4.6–6.5)

## 2016-12-30 MED ORDER — METFORMIN HCL 500 MG PO TABS: 500.0000 mg | ORAL_TABLET | Freq: Two times a day (BID) | ORAL | 3 refills | Status: DC

## 2016-12-30 NOTE — Assessment & Plan Note (Signed)
Voice rest 

## 2016-12-30 NOTE — Assessment & Plan Note (Signed)
Labs On Metformin 

## 2016-12-30 NOTE — Progress Notes (Signed)
Pre visit review using our clinic review tool, if applicable. No additional management support is needed unless otherwise documented below in the visit note. 

## 2016-12-30 NOTE — Patient Instructions (Signed)
Use over-the-counter  "cold" medicines  such as " Delsym" or" Robitussin" cough syrup varietis for cough.  You can use plain "Tylenol" or "Advil" for fever, chills and achyness. Use Halls or Ricola cough drops.   "Common cold" symptoms are usually triggered by a virus.  The antibiotics are usually not necessary. On average, a" viral cold" illness would take 4-7 days to resolve. Please, make an appointment if you are not better or if you're worse.  VOICE REST

## 2016-12-30 NOTE — Progress Notes (Signed)
Subjective:  Patient ID: [name], male    DOB: [birth year]  Age: 66 y.o. MRN: [id number]  CC: No chief complaint on file.   HPI [name] presents for DM, allergies C/o URI x 2-3 d   Outpatient Medications Prior to Visit  Medication Sig Dispense Refill  . aspirin 81 MG EC tablet Take 81 mg by mouth daily.      . cholecalciferol (VITAMIN D) 1000 UNITS tablet Take 1,000 Units by mouth daily.      . metFORMIN (GLUCOPHAGE) 500 MG tablet TAKE 1 TABLET BY MOUTH EVERY MORNING WITH BREAKFAST 90 tablet 0  . Omega-3 Fatty Acids (FISH OIL) 1000 MG CAPS Take 1 capsule by mouth 2 (two) times daily.      Marland Kitchen loratadine (CLARITIN) 10 MG tablet Take 1 tablet (10 mg total) by mouth daily. (Patient not taking: Reported on 12/30/2016) 30 tablet 11  . Olopatadine HCl (PATADAY) 0.2 % SOLN Apply 1 drop to eye daily. (Patient not taking: Reported on 12/30/2016) 2.5 mL 0   No facility-administered medications prior to visit.     ROS Review of Systems  Constitutional: Negative for appetite change, fatigue and unexpected weight change.  HENT: Negative for congestion, nosebleeds, sneezing, sore throat and trouble swallowing.   Eyes: Negative for itching and visual disturbance.  Respiratory: Negative for cough.   Cardiovascular: Negative for chest pain, palpitations and leg swelling.  Gastrointestinal: Negative for abdominal distention, blood in stool, diarrhea and nausea.  Genitourinary: Negative for frequency and hematuria.  Musculoskeletal: Negative for back pain, gait problem, joint swelling and neck pain.  Skin: Negative for rash.  Neurological: Negative for dizziness, tremors, speech difficulty and weakness.  Psychiatric/Behavioral: Negative for agitation, dysphoric mood, sleep disturbance and suicidal ideas. The patient is not nervous/anxious.     Objective:  BP 128/80   Pulse 85   Wt 208 lb (94.3 kg)   SpO2 97%   BMI 29.01 kg/m   BP Readings from Last 3 Encounters:  12/30/16 128/80    07/01/16 120/80  02/17/16 136/84    Wt Readings from Last 3 Encounters:  12/30/16 208 lb (94.3 kg)  07/01/16 208 lb (94.3 kg)  02/17/16 212 lb 8 oz (96.4 kg)    Physical Exam  Constitutional: He is oriented to person, place, and time. He appears well-developed. No distress.  NAD  HENT:  Mouth/Throat: Oropharynx is clear and moist. No oropharyngeal exudate.  Eyes: Conjunctivae are normal. Pupils are equal, round, and reactive to light.  Neck: Normal range of motion. No JVD present. No thyromegaly present.  Cardiovascular: Normal rate, regular rhythm, normal heart sounds and intact distal pulses.  Exam reveals no gallop and no friction rub.   No murmur heard. Pulmonary/Chest: Effort normal and breath sounds normal. No respiratory distress. He has no wheezes. He has no rales. He exhibits no tenderness.  Abdominal: Soft. Bowel sounds are normal. He exhibits no distension and no mass. There is no tenderness. There is no rebound and no guarding.  Musculoskeletal: Normal range of motion. He exhibits no edema or tenderness.  Lymphadenopathy:    He has no cervical adenopathy.  Neurological: He is alert and oriented to person, place, and time. He has normal reflexes. No cranial nerve deficit. He exhibits normal muscle tone. He displays a negative Romberg sign. Coordination and gait normal.  Skin: Skin is warm and dry. No rash noted.  Psychiatric: He has a normal mood and affect. His behavior is normal. Judgment and thought content normal.  hoarse  Lab Results  Component Value Date   WBC 8.9 07/01/2016   HGB 15.7 07/01/2016   HCT 46.5 07/01/2016   PLT 206.0 07/01/2016   GLUCOSE 112 (H) 07/01/2016   CHOL 195 06/26/2015   TRIG 144.0 06/26/2015   HDL 54.20 06/26/2015   LDLDIRECT 126.5 06/14/2013   LDLCALC 112 (H) 06/26/2015   ALT 16 06/26/2015   AST 15 06/26/2015   NA 139 07/01/2016   K 4.6 07/01/2016   CL 103 07/01/2016   CREATININE 1.05 07/01/2016   BUN 21 07/01/2016   CO2 30  07/01/2016   TSH 1.16 07/01/2016   PSA 1.24 07/01/2016   HGBA1C 6.8 (H) 07/01/2016    Dg Chest 2 View  Result Date: 01/26/2013 *RADIOLOGY REPORT* Clinical Data: Acute bronchitis, cough. CHEST - 2 VIEW Comparison: None. Findings: Cardiomediastinal silhouette appears normal.  No acute pulmonary disease is noted.  Bony thorax is intact. IMPRESSION: No acute cardiopulmonary abnormality seen. Original Report Authenticated By: Lupita RaiderJames Green Jr.,  M.D.    Assessment & Plan:   There are no diagnoses linked to this encounter. I am having [name] maintain his aspirin, Fish Oil, cholecalciferol, Olopatadine HCl, loratadine, and metFORMIN.  No orders of the defined types were placed in this encounter.    Follow-up: No Follow-up on file.  Sonda PrimesAlex Mesiah Manzo, MD

## 2016-12-30 NOTE — Assessment & Plan Note (Signed)
Mild. Will watch 

## 2017-03-07 ENCOUNTER — Telehealth: Payer: Self-pay

## 2017-03-07 MED ORDER — METFORMIN HCL 500 MG PO TABS: 500.0000 mg | ORAL_TABLET | Freq: Two times a day (BID) | ORAL | 3 refills | Status: DC

## 2017-03-07 NOTE — Telephone Encounter (Signed)
Spoke with patient, came into office requesting change in pharmacy from [facility] to [facility]. Refilled Metformin 90 day supply with 3 refill s

## 2017-04-30 DIAGNOSIS — N401 Enlarged prostate with lower urinary tract symptoms: Secondary | ICD-10-CM | POA: Diagnosis not present

## 2017-04-30 DIAGNOSIS — R8271 Bacteriuria: Secondary | ICD-10-CM | POA: Diagnosis not present

## 2017-04-30 DIAGNOSIS — N400 Enlarged prostate without lower urinary tract symptoms: Secondary | ICD-10-CM | POA: Diagnosis not present

## 2017-05-02 ENCOUNTER — Encounter: Payer: Self-pay | Admitting: Internal Medicine

## 2017-07-01 ENCOUNTER — Encounter: Payer: Self-pay | Admitting: Internal Medicine

## 2017-07-01 ENCOUNTER — Other Ambulatory Visit (INDEPENDENT_AMBULATORY_CARE_PROVIDER_SITE_OTHER): Payer: Medicare Other

## 2017-07-01 ENCOUNTER — Ambulatory Visit (INDEPENDENT_AMBULATORY_CARE_PROVIDER_SITE_OTHER): Payer: Medicare Other | Admitting: Internal Medicine

## 2017-07-01 DIAGNOSIS — E119 Type 2 diabetes mellitus without complications: Secondary | ICD-10-CM

## 2017-07-01 DIAGNOSIS — N411 Chronic prostatitis: Secondary | ICD-10-CM

## 2017-07-01 DIAGNOSIS — M79606 Pain in leg, unspecified: Secondary | ICD-10-CM | POA: Insufficient documentation

## 2017-07-01 LAB — CK: CK TOTAL: 286 U/L — AB (ref 7–232)

## 2017-07-01 LAB — HEPATIC FUNCTION PANEL
ALT: 14 U/L (ref 0–53)
AST: 14 U/L (ref 0–37)
Albumin: 4.4 g/dL (ref 3.5–5.2)
Alkaline Phosphatase: 60 U/L (ref 39–117)
BILIRUBIN TOTAL: 0.5 mg/dL (ref 0.2–1.2)
Bilirubin, Direct: 0.1 mg/dL (ref 0.0–0.3)
Total Protein: 7.4 g/dL (ref 6.0–8.3)

## 2017-07-01 LAB — BASIC METABOLIC PANEL
BUN: 15 mg/dL (ref 6–23)
CALCIUM: 9.9 mg/dL (ref 8.4–10.5)
CO2: 27 mEq/L (ref 19–32)
CREATININE: 0.86 mg/dL (ref 0.40–1.50)
Chloride: 103 mEq/L (ref 96–112)
GFR: 114.59 mL/min (ref >60.00)
Glucose, Bld: 113 mg/dL — ABNORMAL HIGH (ref 70–99)
Potassium: 4.3 mEq/L (ref 3.5–5.1)
Sodium: 138 mEq/L (ref 135–145)

## 2017-07-01 LAB — HEMOGLOBIN A1C: HEMOGLOBIN A1C: 7.2 % — AB (ref 4.6–6.5)

## 2017-07-01 NOTE — Assessment & Plan Note (Signed)
X 6 months in [facility]

## 2017-07-01 NOTE — Assessment & Plan Note (Signed)
Labs

## 2017-07-01 NOTE — Assessment & Plan Note (Signed)
No change 

## 2017-07-01 NOTE — Progress Notes (Signed)
Subjective:  Patient ID: [name], male    DOB: [birth year]  Age: 66 y.o. MRN: [id number]  CC: No chief complaint on file.   HPI [name] presents for DM,  C/o pain in the legs in am x 6 mo. He sleeps on his stomach.  Outpatient Medications Prior to Visit  Medication Sig Dispense Refill  . aspirin 81 MG EC tablet Take 81 mg by mouth daily.      . cholecalciferol (VITAMIN D) 1000 UNITS tablet Take 1,000 Units by mouth daily.      Marland Kitchen loratadine (CLARITIN) 10 MG tablet Take 1 tablet (10 mg total) by mouth daily. 30 tablet 11  . metFORMIN (GLUCOPHAGE) 500 MG tablet Take 1 tablet (500 mg total) by mouth 2 (two) times daily with a meal. 180 tablet 3  . Olopatadine HCl (PATADAY) 0.2 % SOLN Apply 1 drop to eye daily. 2.5 mL 0  . Omega-3 Fatty Acids (FISH OIL) 1000 MG CAPS Take 1 capsule by mouth 2 (two) times daily.       No facility-administered medications prior to visit.     ROS Review of Systems  Constitutional: Negative for appetite change, fatigue and unexpected weight change.  HENT: Negative for congestion, nosebleeds, sneezing, sore throat and trouble swallowing.   Eyes: Negative for itching and visual disturbance.  Respiratory: Negative for cough.   Cardiovascular: Negative for chest pain, palpitations and leg swelling.  Gastrointestinal: Negative for abdominal distention, blood in stool, diarrhea and nausea.  Genitourinary: Negative for frequency and hematuria.  Musculoskeletal: Negative for back pain, gait problem, joint swelling and neck pain.  Skin: Negative for rash.  Neurological: Negative for dizziness, tremors, speech difficulty and weakness.  Psychiatric/Behavioral: Negative for agitation, dysphoric mood, sleep disturbance and suicidal ideas. The patient is not nervous/anxious.     Objective:  BP 124/80 (BP Location: Left Arm, Patient Position: Sitting, Cuff Size: Large)   Pulse 66   Temp 97.9 F (36.6 C) (Oral)   Ht 5\' 11"  (1.803 m)   Wt 207 lb (93.9 kg)    SpO2 100%   BMI 28.87 kg/m   BP Readings from Last 3 Encounters:  07/01/17 124/80  12/30/16 128/80  07/01/16 120/80    Wt Readings from Last 3 Encounters:  07/01/17 207 lb (93.9 kg)  12/30/16 208 lb (94.3 kg)  07/01/16 208 lb (94.3 kg)    Physical Exam  Constitutional: He is oriented to person, place, and time. He appears well-developed. No distress.  NAD  HENT:  Mouth/Throat: Oropharynx is clear and moist.  Eyes: Pupils are equal, round, and reactive to light. Conjunctivae are normal.  Neck: Normal range of motion. No JVD present. No thyromegaly present.  Cardiovascular: Normal rate, regular rhythm, normal heart sounds and intact distal pulses.  Exam reveals no gallop and no friction rub.   No murmur heard. Pulmonary/Chest: Effort normal and breath sounds normal. No respiratory distress. He has no wheezes. He has no rales. He exhibits no tenderness.  Abdominal: Soft. Bowel sounds are normal. He exhibits no distension and no mass. There is no tenderness. There is no rebound and no guarding.  Musculoskeletal: Normal range of motion. He exhibits tenderness. He exhibits no edema.  Lymphadenopathy:    He has no cervical adenopathy.  Neurological: He is alert and oriented to person, place, and time. He has normal reflexes. No cranial nerve deficit. He exhibits normal muscle tone. He displays a negative Romberg sign. Coordination and gait normal.  Skin: Skin is warm and  dry. No rash noted.  Psychiatric: He has a normal mood and affect. His behavior is normal. Judgment and thought content normal.   Stiff LS spine w/decreased ROM  Lab Results  Component Value Date   WBC 8.9 07/01/2016   HGB 15.7 07/01/2016   HCT 46.5 07/01/2016   PLT 206.0 07/01/2016   GLUCOSE 103 (H) 12/30/2016   CHOL 195 06/26/2015   TRIG 144.0 06/26/2015   HDL 54.20 06/26/2015   LDLDIRECT 126.5 06/14/2013   LDLCALC 112 (H) 06/26/2015   ALT 16 12/30/2016   AST 11 12/30/2016   NA 138 12/30/2016   K 4.6  12/30/2016   CL 102 12/30/2016   CREATININE 0.89 12/30/2016   BUN 16 12/30/2016   CO2 28 12/30/2016   TSH 1.16 07/01/2016   PSA 1.24 07/01/2016   HGBA1C 7.4 (H) 12/30/2016    Dg Chest 2 View  Result Date: 01/26/2013 *RADIOLOGY REPORT* Clinical Data: Acute bronchitis, cough. CHEST - 2 VIEW Comparison: None. Findings: Cardiomediastinal silhouette appears normal.  No acute pulmonary disease is noted.  Bony thorax is intact. IMPRESSION: No acute cardiopulmonary abnormality seen. Original Report Authenticated By: Lupita RaiderJames Green Jr.,  M.D.    Assessment & Plan:   There are no diagnoses linked to this encounter. I am having Mr. Akerson maintain his aspirin, Fish Oil, cholecalciferol, Olopatadine HCl, loratadine, and metFORMIN.  No orders of the defined types were placed in this encounter.    Follow-up: No Follow-up on file.  Sonda PrimesAlex Plotnikov, MD

## 2017-07-02 ENCOUNTER — Other Ambulatory Visit: Payer: Self-pay | Admitting: Internal Medicine

## 2017-07-02 MED ORDER — REPAGLINIDE 1 MG PO TABS: 1.0000 mg | ORAL_TABLET | Freq: Three times a day (TID) | ORAL | 11 refills | Status: DC

## 2017-09-20 ENCOUNTER — Ambulatory Visit: Payer: Medicare Other | Admitting: Family Medicine

## 2017-09-21 NOTE — Progress Notes (Signed)
Flu shot only. Not seen by provider. 

## 2017-09-22 ENCOUNTER — Ambulatory Visit: Payer: Medicare Other | Admitting: Family Medicine

## 2017-09-24 NOTE — Progress Notes (Signed)
[name] [address] [place], [place] [address] Phone: [phone] Subjective:    I'm seeing this patient by the request  of:  Plotnikov, Georgina Quint, MD   CC: Leg pain  [id number]  [name] is a 66 y.o. male coming in with complaint of leg pain bilaterally. Says throughout the day the pain goes away. His pain is worse in the morning. Says sometime his pain is in his calf and hamstring muscles.   Onset- Chronic Location- legs Duration- Only seems to be in the morning Character- lingering pain Aggravating factors-  Reliving factors- patient states activity seems to make it better. Therapies tried- Advil, Aleve, tylenol, ibuprofen  Severity-5 out of 10.     Past Medical History:  Diagnosis Date  . BPH (benign prostatic hypertrophy)    nodularity- Dr. Serena Colonel q 1 year  . Diabetes mellitus type II   . Hx of adenomatous colonic polyps 10/07/2002  . Hx of colonic polyp   . Microscopic hematuria    hx of   Past Surgical History:  Procedure Laterality Date  . CHOLECYSTECTOMY    . COLONOSCOPY W/ POLYPECTOMY     Social History   Social History  . Marital status: Legally Separated    Spouse name: N/A  . Number of children: N/A  . Years of education: N/A   Social History Main Topics  . Smoking status: Former Smoker    Quit date: 08/28/2002  . Smokeless tobacco: Never Used  . Alcohol use No  . Drug use: No  . Sexual activity: Yes   Other Topics Concern  . None   Social History Narrative   Lives alone.  One daughter.  Works in the post office.     No Known Allergies Family History  Problem Relation Age of Onset  . Heart disease Father 48       No details  . Hypertension Mother   . Kidney disease Mother        ESRD  . Colon cancer Neg Hx      Past medical history, social, surgical and family history all reviewed in electronic medical record.  No pertanent information unless stated regarding to the chief complaint.    Review of Systems:Review of systems updated and as accurate as of 09/25/17  No headache, visual changes, nausea, vomiting, diarrhea, constipation, dizziness, abdominal pain, skin rash, fevers, chills, night sweats, weight loss, swollen lymph nodes, body aches, joint swelling,  chest pain, shortness of breath, mood changes. Positive muscle aches  Objective  Blood pressure (!) 144/70, pulse 72, height  (1.803 m), weight 207 lb (93.9 kg), SpO2 95 %. Systems examined below as of 09/25/17   General: No apparent distress alert and oriented x3 mood and affect normal, dressed appropriately.  HEENT: Pupils equal, extraocular movements intact  Respiratory: Patient's speak in full sentences and does not appear short of breath  Cardiovascular: No lower extremity edema, non tender, no erythema  Skin: Warm dry intact with no signs of infection or rash on extremities or on axial skeleton.  Abdomen: Soft nontender  Neuro: Cranial nerves II through XII are intact, neurovascularly intact in all extremities with 2+ DTRs and 2+ pulses.  Lymph: No lymphadenopathy of posterior or anterior cervical chain or axillae bilaterally.  Gait normal with good balance and coordination.  MSK:  Non tender with full range of motion and good stability and symmetric strength and tone of shoulders, elbows, wrist, hip, knee and ankles bilaterally. Mild  arthritic changes of multiple joints    Impression and Recommendations:     This case required medical decision making of moderate complexity.      Note: This dictation was prepared with Dragon dictation along with smaller phrase technology. Any transcriptional errors that result from this process are unintentional.

## 2017-09-25 ENCOUNTER — Ambulatory Visit (INDEPENDENT_AMBULATORY_CARE_PROVIDER_SITE_OTHER): Payer: Medicare Other | Admitting: Family Medicine

## 2017-09-25 ENCOUNTER — Encounter: Payer: Self-pay | Admitting: Family Medicine

## 2017-09-25 DIAGNOSIS — R252 Cramp and spasm: Secondary | ICD-10-CM | POA: Diagnosis not present

## 2017-09-25 NOTE — Patient Instructions (Signed)
Good to see you  Maybe a new mattress may fix everything- look at purple, casper, or the real mattress are kingsdown or beauty rest black Vitamin D 2000 IUdaily  Iron  daily with  of vitamin C  Tonic water before bed (2-3 oz) can help as well.  See me again in 3-4 weeks and if not better we will try a couple more things

## 2017-09-25 NOTE — Assessment & Plan Note (Signed)
Differential is quite broad. Not affecting daily activities. Seems to get better with activity. Patient doesn't have pain that wakes him up but unfortunately in the early mornings has significant stiffness for multiple minutes. We discussed over-the-counter medications designed deficiency. We discussed differential includes potential sleep apnea. I do think that there is also possibility for mechanical symptoms with patient's mattress been over 66 years old. Patient will try to make some very simple changes with over-the-counter medications. Patient follow-up with me again in 4 weeks make sure we are improving otherwise further evaluation.

## 2017-10-16 ENCOUNTER — Encounter: Payer: Self-pay | Admitting: Family Medicine

## 2017-10-16 ENCOUNTER — Ambulatory Visit (INDEPENDENT_AMBULATORY_CARE_PROVIDER_SITE_OTHER)
Admission: RE | Admit: 2017-10-16 | Discharge: 2017-10-16 | Disposition: A | Discharge status: Home / Self Care | Payer: Medicare Other | Source: Ambulatory Visit | Attending: Family Medicine | Admitting: Family Medicine

## 2017-10-16 ENCOUNTER — Other Ambulatory Visit (INDEPENDENT_AMBULATORY_CARE_PROVIDER_SITE_OTHER): Payer: Medicare Other

## 2017-10-16 ENCOUNTER — Ambulatory Visit (INDEPENDENT_AMBULATORY_CARE_PROVIDER_SITE_OTHER): Payer: Medicare Other | Admitting: Family Medicine

## 2017-10-16 VITALS — BP 130/90 | HR 83 | Ht 71.0 in | Wt 209.0 lb

## 2017-10-16 DIAGNOSIS — M549 Dorsalgia, unspecified: Secondary | ICD-10-CM

## 2017-10-16 DIAGNOSIS — E611 Iron deficiency: Secondary | ICD-10-CM

## 2017-10-16 DIAGNOSIS — R5383 Other fatigue: Secondary | ICD-10-CM

## 2017-10-16 DIAGNOSIS — M2550 Pain in unspecified joint: Secondary | ICD-10-CM | POA: Diagnosis not present

## 2017-10-16 DIAGNOSIS — R252 Cramp and spasm: Secondary | ICD-10-CM | POA: Diagnosis not present

## 2017-10-16 DIAGNOSIS — E559 Vitamin D deficiency, unspecified: Secondary | ICD-10-CM | POA: Diagnosis not present

## 2017-10-16 DIAGNOSIS — M545 Low back pain: Secondary | ICD-10-CM | POA: Diagnosis not present

## 2017-10-16 LAB — URIC ACID: URIC ACID, SERUM: 6.2 mg/dL (ref 4.0–7.8)

## 2017-10-16 LAB — SEDIMENTATION RATE: SED RATE: 6 mm/h (ref 0–20)

## 2017-10-16 LAB — IBC PANEL
IRON: 73 ug/dL (ref 42–165)
SATURATION RATIOS: 24.9 % (ref 20.0–50.0)
Transferrin: 209 mg/dL — ABNORMAL LOW (ref 212.0–360.0)

## 2017-10-16 MED ORDER — GABAPENTIN 100 MG PO CAPS: 200.0000 mg | ORAL_CAPSULE | Freq: Every day | ORAL | 3 refills | Status: DC

## 2017-10-16 NOTE — Patient Instructions (Signed)
Good to see you  [name] is your friend.  We will get labs Gabapentin 200mg  at night Exercises 3 times a week.  Stay active See me again in 4 weeks

## 2017-10-16 NOTE — Progress Notes (Signed)
[facility] [address] [place], [place] [address] Phone: [phone] Subjective:     CC: Leg cramping follow-up  BJY:[id number]:Subjective  [name] is a 66 y.o. male coming in with complaint of leg cramping. Seem to be mostly at night. Patient was to get a different mattress. Patient was to try some over-the-counter medications. Patient states Overall he is not made any significant improvement. Continues to have the cramping. Denies any numbness.    Past Medical History:  Diagnosis Date  . BPH (benign prostatic hypertrophy)    nodularity- Dr. Serena [place] q 1 year  . Diabetes mellitus type II   . Hx of adenomatous colonic polyps 10/07/2002  . Hx of colonic polyp   . Microscopic hematuria    hx of   Past Surgical History:  Procedure Laterality Date  . CHOLECYSTECTOMY    . COLONOSCOPY W/ POLYPECTOMY     Social History   Social History  . Marital status: Legally Separated    Spouse name: N/A  . Number of children: N/A  . Years of education: N/A   Social History Main Topics  . Smoking status: Former Smoker    Quit date: 08/28/2002  . Smokeless tobacco: Never Used  . Alcohol use No  . Drug use: No  . Sexual activity: Yes   Other Topics Concern  . None   Social History Narrative   Lives alone.  One daughter.  Works in the post office.     No Known Allergies Family History  Problem Relation Age of Onset  . Heart disease Father [id number]       No details  . Hypertension Mother   . Kidney disease Mother        ESRD  . Colon cancer Neg Hx      Past medical history, social, surgical and family history all reviewed in electronic medical record.  No pertanent information unless stated regarding to the chief complaint.   Review of Systems:Review of systems updated and as accurate as of 10/16/17  No headache, visual changes, nausea, vomiting, diarrhea, constipation, dizziness, abdominal pain, skin rash, fevers, chills, night sweats, weight loss,  swollen lymph nodes, body aches, joint swelling, chest pain, shortness of breath, mood changes. Positive muscle aches  Objective  Blood pressure 130/90, pulse 83, height 5\' 11"  (1.803 m), weight 209 lb (94.8 kg), SpO2 96 %. Systems examined below as of 10/16/17   General: No apparent distress alert and oriented x3 mood and affect normal, dressed appropriately.  HEENT: Pupils equal, extraocular movements intact  Respiratory: Patient's speak in full sentences and does not appear short of breath  Cardiovascular: No lower extremity edema, non tender, no erythema  Skin: Warm dry intact with no signs of infection or rash on extremities or on axial skeleton.  Abdomen: Soft nontender  Neuro: Cranial nerves II through XII are intact, neurovascularly intact in all extremities with 2+ DTRs and 2+ pulses.  Lymph: No lymphadenopathy of posterior or anterior cervical chain or axillae bilaterally.  Gait normal with good balance and coordination.  MSK:  Non tender with full range of motion and good stability and symmetric strength and tone of shoulders, elbows, wrist, hip, knee and ankles bilaterally.  Back Exam:  Inspection: Mild loss light assist Motion: Flexion 45 deg, Extension 25 deg, Side Bending to 45 deg bilaterally,  Rotation to 45 deg bilaterally  SLR laying: Negative  XSLR laying: Negative  Palpable tenderness: Tender to palpation of the paraspinal musculature lumbar spine.  FABER: negative. Sensory change: Gross sensation intact to all lumbar and sacral dermatomes.  Reflexes: 2+ at both patellar tendons, 2+ at achilles tendons, Babinski's downgoing.  Strength at foot  Plantar-flexion: 5/5 Dorsi-flexion: 5/5 Eversion: 5/5 Inversion: 5/5  Leg strength  Quad: 5/5 Hamstring: 5/5 Hip flexor: 5/5 Hip abductors: 5/5  Gait unremarkable.     Impression and Recommendations:     This case required medical decision making of moderate complexity.      Note: This dictation was prepared with  Dragon dictation along with smaller phrase technology. Any transcriptional errors that result from this process are unintentional.

## 2017-10-16 NOTE — Assessment & Plan Note (Signed)
Spent  25 minutes with patient face-to-face and had greater than 50% of counseling including as described in assessment and plan.Continues to have the leg cramping. Not making any significant changes. Patient also has a history of contraction. I believe at this time will do further workup including laboratory workup. Iron deficiency, autoimmune disease, as well as uric acid and we'll be tested. We discussed continuing Doody things over-the-counter. Patient given home exercises and try to help with the back. Spinal stenosis within the differential and patient will have x-rays done. Seems to get better with activity because more with a tendinitis or muscle soreness. Discuss keeping a potential Journal to see if there is any other exacerbating cause. Following up again in 4-6 weeks

## 2017-10-21 LAB — CALCIUM, IONIZED: Calcium, Ion: 5.1 mg/dL (ref 4.8–5.6)

## 2017-10-21 LAB — VITAMIN D 1,25 DIHYDROXY
VITAMIN D 1, 25 (OH) TOTAL: 36 pg/mL (ref 18–72)
VITAMIN D3 1, 25 (OH): 36 pg/mL

## 2017-10-21 LAB — ANTI-NUCLEAR AB-TITER (ANA TITER)

## 2017-10-21 LAB — CYCLIC CITRUL PEPTIDE ANTIBODY, IGG

## 2017-10-21 LAB — PTH, INTACT AND CALCIUM
CALCIUM: 9.5 mg/dL (ref 8.6–10.3)
PTH: 58 pg/mL (ref 14–64)

## 2017-10-21 LAB — ANGIOTENSIN CONVERTING ENZYME: Angiotensin-Converting Enzyme: 49 U/L (ref 9–67)

## 2017-10-21 LAB — ANA: Anti Nuclear Antibody(ANA): POSITIVE — AB

## 2017-10-21 LAB — RHEUMATOID FACTOR: Rhuematoid fact SerPl-aCnc: <14 IU/mL (ref <14)

## 2017-11-13 ENCOUNTER — Encounter: Payer: Self-pay | Admitting: Family Medicine

## 2017-11-13 ENCOUNTER — Ambulatory Visit (INDEPENDENT_AMBULATORY_CARE_PROVIDER_SITE_OTHER): Payer: Medicare Other | Admitting: Family Medicine

## 2017-11-13 VITALS — BP 140/90 | HR 71 | Ht 71.0 in | Wt 213.0 lb

## 2017-11-13 DIAGNOSIS — R252 Cramp and spasm: Secondary | ICD-10-CM | POA: Diagnosis not present

## 2017-11-13 DIAGNOSIS — M549 Dorsalgia, unspecified: Secondary | ICD-10-CM

## 2017-11-13 DIAGNOSIS — M5380 Other specified dorsopathies, site unspecified: Secondary | ICD-10-CM | POA: Insufficient documentation

## 2017-11-13 NOTE — Assessment & Plan Note (Signed)
Sent to PT

## 2017-11-13 NOTE — Progress Notes (Signed)
Tawana ScaleZach Fleeta Kunde D.O. [facility] [address] [place], [place] [address] Phone: [phone] Subjective:    I'm seeing this patient by the request  of:    CC: Back pain follow-up  HQI:[id number]:Subjective  [name] is a 66 y.o. male coming in with complaint of back pain.  Patient was having more leg cramping in the morning.  Patient has been doing the over-the-counter medication as well as gabapentin with very mild improvement.  Patient did have x-rays of the lumbar spine and last exam.  These were independently visualized by me showing no bony abnormality.  Patient states very minimal improvement overall.  Patient had laboratory workup that was unremarkable Patient states within 30 minutes of waking up and seems to resolve and patient's states that is not having any pain.  Still concerned about this in the morning.  Wanting to know what else can be done.    Past Medical History:  Diagnosis Date  . BPH (benign prostatic hypertrophy)    nodularity- Dr. Serena Colonel[place] q 1 year  . Diabetes mellitus type II   . Hx of adenomatous colonic polyps 10/07/2002  . Hx of colonic polyp   . Microscopic hematuria    hx of   Past Surgical History:  Procedure Laterality Date  . CHOLECYSTECTOMY    . COLONOSCOPY W/ POLYPECTOMY     Social History   Socioeconomic History  . Marital status: Legally Separated    Spouse name: None  . Number of children: None  . Years of education: None  . Highest education level: None  Social Needs  . Financial resource strain: None  . Food insecurity - worry: None  . Food insecurity - inability: None  . Transportation needs - medical: None  . Transportation needs - non-medical: None  Occupational History  . None  Tobacco Use  . Smoking status: Former Smoker    Last attempt to quit: 08/28/2002    Years since quitting: 15.2  . Smokeless tobacco: Never Used  Substance and Sexual Activity  . Alcohol use: No    Alcohol/week: 0.0 oz  . Drug use: No  . Sexual  activity: Yes  Other Topics Concern  . None  Social History Narrative   Lives alone.  One daughter.  Works in the post office.     No Known Allergies Family History  Problem Relation Age of Onset  . Heart disease Father 7373       No details  . Hypertension Mother   . Kidney disease Mother        ESRD  . Colon cancer Neg Hx      Past medical history, social, surgical and family history all reviewed in electronic medical record.  No pertanent information unless stated regarding to the chief complaint.   Review of Systems:Review of systems updated and as accurate as of 11/13/17  No headache, visual changes, nausea, vomiting, diarrhea, constipation, dizziness, abdominal pain, skin rash, fevers, chills, night sweats, weight loss, swollen lymph nodes, body aches, joint swelling, muscle aches, chest pain, shortness of breath, mood changes.   Objective  Blood pressure 140/90, pulse 71, height 5\' 11"  (1.803 m), weight 213 lb (96.6 kg), SpO2 98 %. Systems examined below as of 11/13/17   General: No apparent distress alert and oriented x3 mood and affect normal, dressed appropriately.  HEENT: Pupils equal, extraocular movements intact  Respiratory: Patient's speak in full sentences and does not appear short of breath  Cardiovascular: No lower extremity edema, non tender, no  erythema  Skin: Warm dry intact with no signs of infection or rash on extremities or on axial skeleton.  Abdomen: Soft nontender  Neuro: Cranial nerves II through XII are intact, neurovascularly intact in all extremities with 2+ DTRs and 2+ pulses.  Lymph: No lymphadenopathy of posterior or anterior cervical chain or axillae bilaterally.  Gait normal with good balance and coordination.  MSK:  Non tender with full range of motion and good stability and symmetric strength and tone of shoulders, elbows, wrist, hip, knee and ankles bilaterally.  Back Exam:  Inspection: Mild loss of lordosis Motion: Flexion 45 deg, Extension  25 deg, Side Bending to405 deg bilaterally,  Rotation to 35 deg bilaterally  SLR laying: Negative  XSLR laying: Negative  Palpable tenderness: Tender to palpation in the paraspinal musculature minorly over the right. FABER: negative. Sensory change: Gross sensation intact to all lumbar and sacral dermatomes.  Reflexes: 2+ at both patellar tendons, 2+ at achilles tendons, Babinski's downgoing.  Strength at foot  Plantar-flexion: 5/5 Dorsi-flexion: 5/5 Eversion: 5/5 Inversion: 5/5  Leg strength  Quad: 5/5 Hamstring: 5/5 Hip flexor: 5/5 Hip abductors: 5/5  Gait unremarkable.   Impression and Recommendations:     This case required medical decision making of moderate complexity.      Note: This dictation was prepared with Dragon dictation along with smaller phrase technology. Any transcriptional errors that result from this process are unintentional.

## 2017-11-13 NOTE — Patient Instructions (Signed)
Great to see you  Stop the gabapentin  Stop the vitamins except vitamin D I think PT will help and they will call you and may motivate you to workout ;) See me again in 6 weeks if not perfect  Happy holidays!

## 2017-11-13 NOTE — Assessment & Plan Note (Signed)
Continues to have the leg cramping at night.  I do not feel that further evaluation for vascularity is necessary.  Likely still secondary to the tightness in the back.  Patient is going to go to formal physical therapy that I think will be beneficial.  We discussed icing regimen, patient will discontinue [place] medications.  Follow-up with me again in 6 weeks

## 2017-12-19 ENCOUNTER — Encounter: Payer: Self-pay | Admitting: Physical Therapy

## 2017-12-19 ENCOUNTER — Ambulatory Visit (INDEPENDENT_AMBULATORY_CARE_PROVIDER_SITE_OTHER): Payer: Medicare Other | Admitting: Physical Therapy

## 2017-12-19 DIAGNOSIS — M79605 Pain in left leg: Secondary | ICD-10-CM

## 2017-12-19 DIAGNOSIS — M79604 Pain in right leg: Secondary | ICD-10-CM | POA: Diagnosis not present

## 2017-12-19 NOTE — Therapy (Addendum)
[facility] [address] [place], [place], [address] Phone: [phone]   Fax:  [phone]  Physical Therapy Evaluation  Patient Details  Name: [name] MRN: [id number] Date of Birth: 11/21/1951 Referring Provider: Terrilee Files   Encounter Date: 12/19/2017  PT End of Session - 12/19/17 1629    Visit Number  1    Number of Visits  4    Date for PT Re-Evaluation  01/16/18    Authorization Type  Medicare    PT Start Time  1542    PT Stop Time  1630    PT Time Calculation (min)  48 min    Activity Tolerance  Patient tolerated treatment well    Behavior During Therapy  [facility] for tasks assessed/performed       Past Medical History:  Diagnosis Date  . BPH (benign prostatic hypertrophy)    nodularity- Dr. Serena Colonel q 1 year  . Diabetes mellitus type II   . Hx of adenomatous colonic polyps 10/07/2002  . Hx of colonic polyp   . Microscopic hematuria    hx of    Past Surgical History:  Procedure Laterality Date  . CHOLECYSTECTOMY    . COLONOSCOPY W/ POLYPECTOMY      There were no vitals filed for this visit.   Subjective Assessment - 12/19/17 1541    Subjective  Pt states ongoing pain in bil legs,for almost a year,  worst in AM, then eases off throughout the day, doing activity at work. He states no injury to start pain. He works full time, standing and walking at post office. He got a new mattress, and has not noticed any difference in his pain.     Limitations  Sitting;Walking;Standing;House hold activities    Patient Stated Goals  Decreased pain     Currently in Pain?  Yes    Pain Score  4     Pain Location  Leg    Pain Orientation  Posterior    Pain Descriptors / Indicators  Aching;Nagging    Pain Type  Acute pain    Aggravating Factors   In Am, standing, walking, bending    Pain Relieving Factors  increased activity          [facility] PT Assessment - 12/19/17 0001      Assessment   Medical Diagnosis  Bilateral Leg  pain    Referring Provider  Terrilee Files    Prior Therapy  no      Precautions   Precautions  None      Balance Screen   Has the patient fallen in the past 6 months  No      Prior Function   Level of Independence  Independent      ROM / Strength   AROM / PROM / Strength  AROM;Strength      AROM   Overall AROM Comments  WFL for LEs      Strength   Overall Strength Comments  WFL for LEs       Flexibility   Soft Tissue Assessment /Muscle Length  yes    Hamstrings  significant limitation      Palpation   Palpation comment  Tightness in entire posterior chain. Unable to reproduce pain with testing today; negaive Neural tension;              Objective measurements completed on examination: See above findings.              PT Education -  12/19/17 1613    Education provided  Yes    Education Details  HEP     Person(s) Educated  Patient    Methods  Explanation;Demonstration;Verbal cues;Handout          PT Long Term Goals - 12/19/17 1635      PT LONG TERM GOAL #1   Title  Pt to demo decreased pain to 2/10 in AM with activity     Time  4    Period  Weeks    Status  New    Target Date  01/16/18      PT LONG TERM GOAL #2   Title  Pt to demo independence and compliance with HEP.     Time  4    Period  Weeks    Status  New    Target Date  01/16/18             Plan - 12/19/17 1630    Clinical Impression Statement  Pt presents with primary complaint of increased pain in bil LEs. He demonstrates tightness in B hamstrings, gastrocs, and lumbar region (entire posterior chain). He has no pain or tenderness with testing today, unable to reproduce pain, but pt states is significant first thing in AM. Pt with decrearsed ability for functional activities and ADLS in am due to pain. Pt with negative testing for radiculopathy, denies numbness/tingling, and has negative x-ray.  Pt to benefit from skilled PT to improve deficits. HEP given today for LE and lumbar  ROM and stretching. Expect that increasing activity level, ROM, stretching and circulation with improve pt's pain.      Clinical Presentation  Stable    Clinical Decision Making  Low    Rehab Potential  Fair    PT Frequency  1x / week    PT Duration  4 weeks    PT Treatment/Interventions  Cryotherapy;Electrical Stimulation;Moist Heat;Therapeutic exercise;Therapeutic activities;Stair training;Gait training;Ultrasound;Balance training;Neuromuscular re-education;Patient/family education;Orthotic Fit/Training;Manual techniques;Taping;Dry needling;Passive range of motion    PT Next Visit Plan  Review and progress HEP, LE ROM and strength, lumbar ROM       Patient will benefit from skilled therapeutic intervention in order to improve the following deficits and impairments:  Hypomobility, Decreased activity tolerance, Pain, Increased muscle spasms, Difficulty walking  Visit Diagnosis: Pain in left leg - Plan: PT plan of care cert/re-cert  Pain in right leg - Plan: PT plan of care cert/re-cert     Problem List Patient Active Problem List   Diagnosis Date Noted  . Back tightness 11/13/2017  . Leg cramping 09/25/2017  . Leg pain, anterior 07/01/2017  . Laryngitis 12/30/2016  . Rhinitis, allergic 02/17/2016  . PVC (premature ventricular contraction) 10/31/2014  . Abnormal EKG 10/31/2014  . Chest wall pain 12/22/2013  . Costochondritis 12/22/2013  . Acute bronchitis 01/26/2013  . Elevated BP 12/25/2012  . Chronic UTI 01/23/2012  . Well adult exam 07/25/2011  . TOBACCO USE, QUIT 01/04/2010  . Prostatitis 07/06/2009  . DM2 (diabetes mellitus, type 2) (HCC) 02/08/2008  . BPH (benign prostatic hyperplasia) 02/08/2008  . Other abnormal glucose 02/01/2008  . Hx of adenomatous colonic polyps 10/07/2002     There ex performed on 12/19/17: SKTC 30 sec x3/ DKTC 30 sec x2 LTR x15 Seated hamstring stretch 30 sec x3 Gastroc stretch, standing at wall 30 sec x3   Sedalia Muta, PT, DPT 4:44  PM  12/19/17      [facility] [address] [place], [place], [address]  Phone: [phone]   Fax:  [phone]  Name: [name] MRN: [id number] Date of Birth: [birth year]

## 2017-12-24 ENCOUNTER — Encounter: Payer: Self-pay | Admitting: Physical Therapy

## 2017-12-24 ENCOUNTER — Ambulatory Visit (INDEPENDENT_AMBULATORY_CARE_PROVIDER_SITE_OTHER): Payer: Medicare Other | Admitting: Physical Therapy

## 2017-12-24 DIAGNOSIS — M79605 Pain in left leg: Secondary | ICD-10-CM | POA: Diagnosis not present

## 2017-12-24 DIAGNOSIS — M79604 Pain in right leg: Secondary | ICD-10-CM | POA: Diagnosis not present

## 2017-12-24 NOTE — Therapy (Deleted)
[facility] [address] [place], , [address] Phone: [phone]   Fax:  [phone]  Physical Therapy Treatment  Patient Details  Name: [name] MRN: [id number] Date of Birth: 09/18/1951 Referring Provider: Zach Smith   Encounter Date: 12/24/2017  PT End of Session - 12/24/17 1506    Visit Number  2    Number of Visits  4    Date for PT Re-Evaluation  01/16/18    Authorization Type  Medicare    PT Start Time  1458    PT Stop Time  1551    PT Time Calculation (min)  53 min    Activity Tolerance  Patient tolerated treatment well    Behavior During Therapy  WFL for tasks assessed/performed       Past Medical History:  Diagnosis Date  . BPH (benign prostatic hypertrophy)    nodularity- Dr. Kimborough q 1 year  . Diabetes mellitus type II   . Hx of adenomatous colonic polyps 10/07/2002  . Hx of colonic polyp   . Microscopic hematuria    hx of    Past Surgical History:  Procedure Laterality Date  . CHOLECYSTECTOMY    . COLONOSCOPY W/ POLYPECTOMY      There were no vitals filed for this visit.  Subjective Assessment - 12/24/17 1504    Subjective  Pt states minimal pain in AM since Friday. He has soreness in legs, but has not been as intense as previously.  He has been doing exercises. He has had new mattress for 1 month, states that he thinks he is now getting used to it.     Currently in Pain?  No/denies    Pain Score  0-No pain                      [facility] Adult PT Treatment/Exercise - 12/24/17 0001      Exercises   Exercises  Lumbar;Ankle      Lumbar Exercises: Stretches   Active Hamstring Stretch  3 reps;30 seconds    Active Hamstring Stretch Limitations  seated    Single Knee to Chest Stretch  --    Double Knee to Chest Stretch  30 seconds;2 reps    Lower Trunk Rotation  -- x20      Lumbar Exercises: Aerobic   Stationary Bike  L1 x 8 min      Lumbar Exercises: Standing   Other  Standing Lumbar Exercises  Marching x20      Lumbar Exercises: Seated   Long Arc Quad on Chair  20 reps;Weights    LAQ on Chair Weights (lbs)  2lb    Other Seated Lumbar Exercises  Fwd flexion with PBall x4 min      Ankle Exercises: Stretches   Gastroc Stretch  3 reps;30 seconds    Gastroc Stretch Limitations  At wall              PT Education - 12/24/17 1505    Education provided  Yes    Education Details  Progression and encouragement of HEP    Person(s) Educated  Patient    Methods  Explanation    Comprehension  Verbalized understanding          PT Long Term Goals - 12/19/17 1635      PT LONG TERM GOAL #1   Title  Pt to demo decreased pain to 2/10 in AM with activity     Time  4      Period  Weeks    Status  New    Target Date  01/16/18      PT LONG TERM GOAL #2   Title  Pt to demo independence and compliance with HEP.     Time  4    Period  Weeks    Status  New    Target Date  01/16/18            Plan - 12/24/17 1557    Clinical Impression Statement  Pt able to progress ther ex today, with no increased pain. ROM and light strengthening done for lumbar and LEs. Plan to progress as tolerated.     Clinical Presentation  Stable    Clinical Decision Making  Low    Rehab Potential  Good    PT Frequency  1x / week    PT Duration  4 weeks    PT Treatment/Interventions  Cryotherapy;Electrical Stimulation;Moist Heat;Therapeutic exercise;Therapeutic activities;Stair training;Gait training;Ultrasound;Balance training;Neuromuscular re-education;Patient/family education;Orthotic Fit/Training;Manual techniques;Taping;Dry needling;Passive range of motion       Patient will benefit from skilled therapeutic intervention in order to improve the following deficits and impairments:  Hypomobility, Decreased activity tolerance, Pain, Increased muscle spasms, Difficulty walking  Visit Diagnosis: Pain in left leg  Pain in right leg     Problem List Patient Active  Problem List   Diagnosis Date Noted  . Back tightness 11/13/2017  . Leg cramping 09/25/2017  . Leg pain, anterior 07/01/2017  . Laryngitis 12/30/2016  . Rhinitis, allergic 02/17/2016  . PVC (premature ventricular contraction) 10/31/2014  . Abnormal EKG 10/31/2014  . Chest wall pain 12/22/2013  . Costochondritis 12/22/2013  . Acute bronchitis 01/26/2013  . Elevated BP 12/25/2012  . Chronic UTI 01/23/2012  . Well adult exam 07/25/2011  . TOBACCO USE, QUIT 01/04/2010  . Prostatitis 07/06/2009  . DM2 (diabetes mellitus, type 2) (HCC) 02/08/2008  . BPH (benign prostatic hyperplasia) 02/08/2008  . Other abnormal glucose 02/01/2008  . Hx of adenomatous colonic polyps 10/07/2002   Sedalia MutaLauren Ranie Chinchilla, PT, DPT 3:59 PM  12/24/17    [facility] [address] [place], [place], [address] Phone: [phone]   Fax:  [phone]  Name: [name] MRN: [id number] Date of Birth: 1951-05-29

## 2017-12-24 NOTE — Therapy (Addendum)
[facility] [address] [place], , [address] Phone: [phone]   Fax:  [phone]  Physical Therapy Treatment  Patient Details  Name: [name] MRN: [id number] Date of Birth: 09/18/1951 Referring Provider: Zach Smith   Encounter Date: 12/24/2017  PT End of Session - 12/24/17 1506    Visit Number  2    Number of Visits  4    Date for PT Re-Evaluation  01/16/18    Authorization Type  Medicare    PT Start Time  1458    PT Stop Time  1551    PT Time Calculation (min)  53 min    Activity Tolerance  Patient tolerated treatment well    Behavior During Therapy  WFL for tasks assessed/performed       Past Medical History:  Diagnosis Date  . BPH (benign prostatic hypertrophy)    nodularity- Dr. Kimborough q 1 year  . Diabetes mellitus type II   . Hx of adenomatous colonic polyps 10/07/2002  . Hx of colonic polyp   . Microscopic hematuria    hx of    Past Surgical History:  Procedure Laterality Date  . CHOLECYSTECTOMY    . COLONOSCOPY W/ POLYPECTOMY      There were no vitals filed for this visit.  Subjective Assessment - 12/24/17 1504    Subjective  Pt states minimal pain in AM since Friday. He has soreness in legs, but has not been as intense as previously.  He has been doing exercises. He has had new mattress for 1 month, states that he thinks he is now getting used to it.     Currently in Pain?  No/denies    Pain Score  0-No pain                      [facility] Adult PT Treatment/Exercise - 12/24/17 0001      Exercises   Exercises  Lumbar;Ankle      Lumbar Exercises: Stretches   Active Hamstring Stretch  3 reps;30 seconds    Active Hamstring Stretch Limitations  seated    Single Knee to Chest Stretch  --    Double Knee to Chest Stretch  30 seconds;2 reps    Lower Trunk Rotation  -- x20      Lumbar Exercises: Aerobic   Stationary Bike  L1 x 8 min      Lumbar Exercises: Standing   Other  Standing Lumbar Exercises  Marching x20      Lumbar Exercises: Seated   Long Arc Quad on Chair  20 reps;Weights    LAQ on Chair Weights (lbs)  2lb    Other Seated Lumbar Exercises  Fwd flexion with PBall x4 min      Ankle Exercises: Stretches   Gastroc Stretch  3 reps;30 seconds    Gastroc Stretch Limitations  At wall              PT Education - 12/24/17 1505    Education provided  Yes    Education Details  Progression and encouragement of HEP    Person(s) Educated  Patient    Methods  Explanation    Comprehension  Verbalized understanding          PT Long Term Goals - 12/19/17 1635      PT LONG TERM GOAL #1   Title  Pt to demo decreased pain to 2/10 in AM with activity     Time  4      Period  Weeks    Status  New    Target Date  01/16/18      PT LONG TERM GOAL #2   Title  Pt to demo independence and compliance with HEP.     Time  4    Period  Weeks    Status  New    Target Date  01/16/18            Plan - 12/24/17 1557    Clinical Impression Statement  Pt able to progress ther ex today, with no increased pain. ROM and light strengthening done for lumbar and LEs. Plan to progress as tolerated.     Clinical Presentation  Stable    Clinical Decision Making  Low    Rehab Potential  Good    PT Frequency  1x / week    PT Duration  4 weeks    PT Treatment/Interventions  Cryotherapy;Electrical Stimulation;Moist Heat;Therapeutic exercise;Therapeutic activities;Stair training;Gait training;Ultrasound;Balance training;Neuromuscular re-education;Patient/family education;Orthotic Fit/Training;Manual techniques;Taping;Dry needling;Passive range of motion       Patient will benefit from skilled therapeutic intervention in order to improve the following deficits and impairments:  Hypomobility, Decreased activity tolerance, Pain, Increased muscle spasms, Difficulty walking  Visit Diagnosis: Pain in left leg  Pain in right leg     Problem List Patient Active  Problem List   Diagnosis Date Noted  . Back tightness 11/13/2017  . Leg cramping 09/25/2017  . Leg pain, anterior 07/01/2017  . Laryngitis 12/30/2016  . Rhinitis, allergic 02/17/2016  . PVC (premature ventricular contraction) 10/31/2014  . Abnormal EKG 10/31/2014  . Chest wall pain 12/22/2013  . Costochondritis 12/22/2013  . Acute bronchitis 01/26/2013  . Elevated BP 12/25/2012  . Chronic UTI 01/23/2012  . Well adult exam 07/25/2011  . TOBACCO USE, QUIT 01/04/2010  . Prostatitis 07/06/2009  . DM2 (diabetes mellitus, type 2) ([place]) 02/08/2008  . BPH (benign prostatic hyperplasia) 02/08/2008  . Other abnormal glucose 02/01/2008  . Hx of adenomatous colonic polyps 10/07/2002   Lyndee Hensen, PT, DPT 4:00 PM  12/24/17    [place], [place], [address] Phone: [phone]   Fax:  [phone]  Name: [name] MRN: [id number] Date of Birth: 08-19-1951   PHYSICAL THERAPY DISCHARGE SUMMARY  Visits from Start of Care: 2  Pt did not return after last visit     Plan: Patient agrees to discharge.  Patient goals were not met. Patient is being discharged due to not returning since the last visit.  ?????       Lyndee Hensen, PT, DPT 3:49 PM  02/23/18

## 2017-12-31 ENCOUNTER — Ambulatory Visit (INDEPENDENT_AMBULATORY_CARE_PROVIDER_SITE_OTHER): Payer: Medicare Other | Admitting: Internal Medicine

## 2017-12-31 ENCOUNTER — Encounter: Payer: Self-pay | Admitting: Internal Medicine

## 2017-12-31 ENCOUNTER — Ambulatory Visit: Payer: Medicare Other | Admitting: Family Medicine

## 2017-12-31 DIAGNOSIS — R030 Elevated blood-pressure reading, without diagnosis of hypertension: Secondary | ICD-10-CM

## 2017-12-31 DIAGNOSIS — N401 Enlarged prostate with lower urinary tract symptoms: Secondary | ICD-10-CM | POA: Diagnosis not present

## 2017-12-31 DIAGNOSIS — E119 Type 2 diabetes mellitus without complications: Secondary | ICD-10-CM | POA: Diagnosis not present

## 2017-12-31 DIAGNOSIS — R252 Cramp and spasm: Secondary | ICD-10-CM

## 2017-12-31 DIAGNOSIS — R350 Frequency of micturition: Secondary | ICD-10-CM

## 2017-12-31 MED ORDER — REPAGLINIDE 1 MG PO TABS: 1.0000 mg | ORAL_TABLET | Freq: Three times a day (TID) | ORAL | 3 refills | Status: DC

## 2017-12-31 MED ORDER — CYCLOBENZAPRINE HCL 5 MG PO TABS: 5.0000 mg | ORAL_TABLET | Freq: Two times a day (BID) | ORAL | 3 refills | Status: DC | PRN

## 2017-12-31 NOTE — Assessment & Plan Note (Signed)
BP Readings from Last 3 Encounters:  12/31/17 126/78  11/13/17 140/90  10/16/17 130/90

## 2017-12-31 NOTE — Assessment & Plan Note (Signed)
Metformin 

## 2017-12-31 NOTE — Assessment & Plan Note (Addendum)
Dr Katrinka BlazingSmith FeSO4, Vit C PT; new matrass   tight hamstrings B - trial of Flexeril at hs. Try CoQ10 at night

## 2017-12-31 NOTE — Patient Instructions (Signed)
Try CoQ10 at night

## 2017-12-31 NOTE — Progress Notes (Unsigned)
Charles ScaleZach Rosco Braun D.O. [name] [address] [place], [place] [address] Phone: [phone] Subjective:      CC: Leg cramping follow-up  OZH:[id number]:Subjective  [name] is a 67 y.o. male coming in with complaint of   Onset-  Location Duration-  Character- Aggravating factors- Reliving factors-  Therapies tried-  Severity-     Past Medical History:  Diagnosis Date  . BPH (benign prostatic hypertrophy)    nodularity- Dr. Serena [place] q 1 year  . Diabetes mellitus type II   . Hx of adenomatous colonic polyps 10/07/2002  . Hx of colonic polyp   . Microscopic hematuria    hx of   Past Surgical History:  Procedure Laterality Date  . CHOLECYSTECTOMY    . COLONOSCOPY W/ POLYPECTOMY     Social History   Socioeconomic History  . Marital status: Legally Separated    Spouse name: Not on file  . Number of children: Not on file  . Years of education: Not on file  . Highest education level: Not on file  Social Needs  . Financial resource strain: Not on file  . Food insecurity - worry: Not on file  . Food insecurity - inability: Not on file  . Transportation needs - medical: Not on file  . Transportation needs - non-medical: Not on file  Occupational History  . Not on file  Tobacco Use  . Smoking status: Former Smoker    Last attempt to quit: 08/28/2002    Years since quitting: 15.3  . Smokeless tobacco: Never Used  Substance and Sexual Activity  . Alcohol use: No    Alcohol/week: 0.0 oz  . Drug use: No  . Sexual activity: Yes  Other Topics Concern  . Not on file  Social History Narrative   Lives alone.  One daughter.  Works in the post office.     No Known Allergies Family History  Problem Relation Age of Onset  . Heart disease Father [id number]       No details  . Hypertension Mother   . Kidney disease Mother        ESRD  . Colon cancer Neg Hx      Past medical history, social, surgical and family history all reviewed in electronic medical record.  No  pertanent information unless stated regarding to the chief complaint.   Review of Systems:Review of systems updated and as accurate as of 12/31/17  No headache, visual changes, nausea, vomiting, diarrhea, constipation, dizziness, abdominal pain, skin rash, fevers, chills, night sweats, weight loss, swollen lymph nodes, body aches, joint swelling, muscle aches, chest pain, shortness of breath, mood changes.   Objective  There were no vitals taken for this visit. Systems examined below as of 12/31/17   General: No apparent distress alert and oriented x3 mood and affect normal, dressed appropriately.  HEENT: Pupils equal, extraocular movements intact  Respiratory: Patient's speak in full sentences and does not appear short of breath  Cardiovascular: No lower extremity edema, non tender, no erythema  Skin: Warm dry intact with no signs of infection or rash on extremities or on axial skeleton.  Abdomen: Soft nontender  Neuro: Cranial nerves II through XII are intact, neurovascularly intact in all extremities with 2+ DTRs and 2+ pulses.  Lymph: No lymphadenopathy of posterior or anterior cervical chain or axillae bilaterally.  Gait normal with good balance and coordination.  MSK:  Non tender with full range of motion and good stability and symmetric strength and tone of shoulders,  elbows, wrist, hip, knee and ankles bilaterally.     Impression and Recommendations:     This case required medical decision making of moderate complexity.      Note: This dictation was prepared with Dragon dictation along with smaller phrase technology. Any transcriptional errors that result from this process are unintentional.

## 2017-12-31 NOTE — Assessment & Plan Note (Signed)
Labs, UA 

## 2017-12-31 NOTE — Progress Notes (Signed)
Subjective:  Patient ID: [name], male    DOB: January 07, 1951  Age: 67 y.o. MRN: [id number]  CC: No chief complaint on file.   HPI [name] presents for DM, LBP/leg cramps, allergies f/u  Outpatient Medications Prior to Visit  Medication Sig Dispense Refill  . aspirin 81 MG EC tablet Take 81 mg by mouth daily.      . Cholecalciferol (VITAMIN D) 2000 units CAPS Take 2,000 Units by mouth daily.     Marland Kitchen loratadine (CLARITIN) 10 MG tablet Take 1 tablet (10 mg total) by mouth daily. 30 tablet 11  . metFORMIN (GLUCOPHAGE) 500 MG tablet Take 1 tablet (500 mg total) by mouth 2 (two) times daily with a meal. 180 tablet 3  . Omega-3 Fatty Acids (FISH OIL) 1000 MG CAPS Take 1 capsule by mouth 2 (two) times daily.      . repaglinide (PRANDIN) 1 MG tablet Take 1 tablet (1 mg total) by mouth 3 (three) times daily before meals. 90 tablet 11  . gabapentin (NEURONTIN) 100 MG capsule Take 2 capsules (200 mg total) by mouth at bedtime. (Patient not taking: Reported on 12/19/2017) 60 capsule 3  . Olopatadine HCl (PATADAY) 0.2 % SOLN Apply 1 drop to eye daily. 2.5 mL 0   No facility-administered medications prior to visit.     ROS Review of Systems  Constitutional: Negative for appetite change, fatigue and unexpected weight change.  HENT: Negative for congestion, nosebleeds, sneezing, sore throat and trouble swallowing.   Eyes: Negative for itching and visual disturbance.  Respiratory: Negative for cough.   Cardiovascular: Negative for chest pain, palpitations and leg swelling.  Gastrointestinal: Negative for abdominal distention, blood in stool, diarrhea and nausea.  Genitourinary: Negative for frequency and hematuria.  Musculoskeletal: Positive for back pain. Negative for gait problem, joint swelling and neck pain.  Skin: Negative for rash.  Neurological: Negative for dizziness, tremors, speech difficulty and weakness.  Psychiatric/Behavioral: Negative for agitation, dysphoric mood and sleep  disturbance. The patient is not nervous/anxious.     Objective:  BP 126/78 (BP Location: Left Arm, Patient Position: Sitting, Cuff Size: Large)   Pulse 78   Temp 98.2 F (36.8 C) (Oral)   Ht 5\' 11"  (1.803 m)   Wt 212 lb (96.2 kg)   SpO2 98%   BMI 29.57 kg/m   BP Readings from Last 3 Encounters:  12/31/17 126/78  11/13/17 140/90  10/16/17 130/90    Wt Readings from Last 3 Encounters:  12/31/17 212 lb (96.2 kg)  11/13/17 213 lb (96.6 kg)  10/16/17 209 lb (94.8 kg)    Physical Exam  Constitutional: He is oriented to person, place, and time. He appears well-developed. No distress.  NAD  HENT:  Mouth/Throat: Oropharynx is clear and moist.  Eyes: Conjunctivae are normal. Pupils are equal, round, and reactive to light.  Neck: Normal range of motion. No JVD present. No thyromegaly present.  Cardiovascular: Normal rate, regular rhythm, normal heart sounds and intact distal pulses. Exam reveals no gallop and no friction rub.  No murmur heard. Pulmonary/Chest: Effort normal and breath sounds normal. No respiratory distress. He has no wheezes. He has no rales. He exhibits no tenderness.  Abdominal: Soft. Bowel sounds are normal. He exhibits no distension and no mass. There is no tenderness. There is no rebound and no guarding.  Musculoskeletal: Normal range of motion. He exhibits no edema or tenderness.  Lymphadenopathy:    He has no cervical adenopathy.  Neurological: He is alert and oriented  to person, place, and time. He has normal reflexes. No cranial nerve deficit. He exhibits normal muscle tone. He displays a negative Romberg sign. Coordination and gait normal.  Skin: Skin is warm and dry. No rash noted.  Psychiatric: He has a normal mood and affect. His behavior is normal. Judgment and thought content normal.  tight hamstrings B LS NT  Lab Results  Component Value Date   WBC 8.9 07/01/2016   HGB 15.7 07/01/2016   HCT 46.5 07/01/2016   PLT 206.0 07/01/2016   GLUCOSE 113  (H) 07/01/2017   CHOL 195 06/26/2015   TRIG 144.0 06/26/2015   HDL 54.20 06/26/2015   LDLDIRECT 126.5 06/14/2013   LDLCALC 112 (H) 06/26/2015   ALT 14 07/01/2017   AST 14 07/01/2017   NA 138 07/01/2017   K 4.3 07/01/2017   CL 103 07/01/2017   CREATININE 0.86 07/01/2017   BUN 15 07/01/2017   CO2 27 07/01/2017   TSH 1.16 07/01/2016   PSA 1.24 07/01/2016   HGBA1C 7.2 (H) 07/01/2017    Dg Lumbar Spine Complete  Result Date: 10/16/2017 CLINICAL DATA:  Chronic back and bilateral leg pain, no injury EXAM: LUMBAR SPINE - COMPLETE 4+ VIEW COMPARISON:  None. FINDINGS: The lumbar vertebrae are in normal alignment. Intervertebral disc spaces appear relatively normal. No compression deformity is seen. The SI joints appear corticated. Calcifications in the mid lower pelvis most likely are prostatic in origin. Surgical clips are noted in the right upper quadrant from prior cholecystectomy. IMPRESSION: Normal alignment with normal intervertebral disc spaces. No acute abnormality. Electronically Signed   By: Dwyane DeePaul  Barry M.D.   On: 10/16/2017 16:32    Assessment & Plan:   There are no diagnoses linked to this encounter. I have discontinued Hessie Dienerlan Housley's Olopatadine HCl and gabapentin. I am also having him maintain his aspirin, Fish Oil, Vitamin D, loratadine, metFORMIN, and repaglinide.  No orders of the defined types were placed in this encounter.    Follow-up: No Follow-up on file.  Sonda PrimesAlex Kacee Sukhu, MD

## 2018-03-17 DIAGNOSIS — K644 Residual hemorrhoidal skin tags: Secondary | ICD-10-CM | POA: Diagnosis not present

## 2018-03-27 DIAGNOSIS — K644 Residual hemorrhoidal skin tags: Secondary | ICD-10-CM | POA: Diagnosis not present

## 2018-05-08 DIAGNOSIS — N400 Enlarged prostate without lower urinary tract symptoms: Secondary | ICD-10-CM | POA: Diagnosis not present

## 2018-05-15 DIAGNOSIS — N400 Enlarged prostate without lower urinary tract symptoms: Secondary | ICD-10-CM | POA: Diagnosis not present

## 2018-06-30 ENCOUNTER — Ambulatory Visit (INDEPENDENT_AMBULATORY_CARE_PROVIDER_SITE_OTHER): Payer: Medicare Other | Admitting: Internal Medicine

## 2018-06-30 ENCOUNTER — Other Ambulatory Visit (INDEPENDENT_AMBULATORY_CARE_PROVIDER_SITE_OTHER): Payer: Medicare Other

## 2018-06-30 ENCOUNTER — Encounter: Payer: Self-pay | Admitting: Internal Medicine

## 2018-06-30 DIAGNOSIS — E119 Type 2 diabetes mellitus without complications: Secondary | ICD-10-CM

## 2018-06-30 DIAGNOSIS — R030 Elevated blood-pressure reading, without diagnosis of hypertension: Secondary | ICD-10-CM | POA: Diagnosis not present

## 2018-06-30 LAB — CBC WITH DIFFERENTIAL/PLATELET
BASOS ABS: 0.1 10*3/uL (ref 0.0–0.1)
Basophils Relative: 0.8 % (ref 0.0–3.0)
Eosinophils Absolute: 0.2 10*3/uL (ref 0.0–0.7)
Eosinophils Relative: 2.3 % (ref 0.0–5.0)
HCT: 46.6 % (ref 39.0–52.0)
Hemoglobin: 15.8 g/dL (ref 13.0–17.0)
LYMPHS ABS: 2.7 10*3/uL (ref 0.7–4.0)
Lymphocytes Relative: 29.8 % (ref 12.0–46.0)
MCHC: 33.9 g/dL (ref 30.0–36.0)
MCV: 94.3 fl (ref 78.0–100.0)
MONO ABS: 0.8 10*3/uL (ref 0.1–1.0)
Monocytes Relative: 9.2 % (ref 3.0–12.0)
NEUTROS PCT: 57.9 % (ref 43.0–77.0)
Neutro Abs: 5.2 10*3/uL (ref 1.4–7.7)
Platelets: 185 10*3/uL (ref 150.0–400.0)
RBC: 4.94 Mil/uL (ref 4.22–5.81)
RDW: 14.1 % (ref 11.5–15.5)
WBC: 8.9 10*3/uL (ref 4.0–10.5)

## 2018-06-30 LAB — BASIC METABOLIC PANEL
BUN: 14 mg/dL (ref 6–23)
CALCIUM: 10 mg/dL (ref 8.4–10.5)
CHLORIDE: 103 meq/L (ref 96–112)
CO2: 26 mEq/L (ref 19–32)
Creatinine, Ser: 0.92 mg/dL (ref 0.40–1.50)
GFR: 105.68 mL/min (ref >60.00)
Glucose, Bld: 117 mg/dL — ABNORMAL HIGH (ref 70–99)
Potassium: 4.1 mEq/L (ref 3.5–5.1)
SODIUM: 139 meq/L (ref 135–145)

## 2018-06-30 LAB — HEMOGLOBIN A1C: Hgb A1c MFr Bld: 7.8 % — ABNORMAL HIGH (ref 4.6–6.5)

## 2018-06-30 NOTE — Patient Instructions (Signed)
MC well w/Jill 

## 2018-06-30 NOTE — Assessment & Plan Note (Signed)
NAS 

## 2018-06-30 NOTE — Progress Notes (Signed)
Subjective:  Patient ID: [name], male    DOB: 1951-02-22  Age: 67 y.o. MRN: [id number]  CC: No chief complaint on file.   HPI [name] presents for DM, HTN, UTI f/u  Outpatient Medications Prior to Visit  Medication Sig Dispense Refill  . aspirin 81 MG EC tablet Take 81 mg by mouth daily.      . Cholecalciferol (VITAMIN D) 2000 units CAPS Take 2,000 Units by mouth daily.     . cyclobenzaprine (FLEXERIL) 5 MG tablet Take 1-2 tablets (5-10 mg total) by mouth 3 times/day as needed-between meals & bedtime for muscle spasms. 60 tablet 3  . loratadine (CLARITIN) 10 MG tablet Take 1 tablet (10 mg total) by mouth daily. 30 tablet 11  . metFORMIN (GLUCOPHAGE) 500 MG tablet Take 1 tablet (500 mg total) by mouth 2 (two) times daily with a meal. 180 tablet 3  . Omega-3 Fatty Acids (FISH OIL) 1000 MG CAPS Take 1 capsule by mouth 2 (two) times daily.      . repaglinide (PRANDIN) 1 MG tablet Take 1 tablet (1 mg total) by mouth 3 (three) times daily before meals. 270 tablet 3   No facility-administered medications prior to visit.     ROS: Review of Systems  Constitutional: Negative for appetite change, fatigue and unexpected weight change.  HENT: Negative for congestion, nosebleeds, sneezing, sore throat and trouble swallowing.   Eyes: Negative for itching and visual disturbance.  Respiratory: Negative for cough.   Cardiovascular: Negative for chest pain, palpitations and leg swelling.  Gastrointestinal: Negative for abdominal distention, blood in stool, diarrhea and nausea.  Genitourinary: Negative for frequency and hematuria.  Musculoskeletal: Negative for back pain, gait problem, joint swelling and neck pain.  Skin: Negative for rash.  Neurological: Negative for dizziness, tremors, speech difficulty and weakness.  Psychiatric/Behavioral: Negative for agitation, dysphoric mood and sleep disturbance. The patient is not nervous/anxious.     Objective:  BP 128/72 (BP Location: Left  Arm, Patient Position: Sitting, Cuff Size: Large)   Pulse 84   Temp 98.2 F (36.8 C) (Oral)   Ht 5\' 11"  (1.803 m)   Wt 212 lb (96.2 kg)   SpO2 96%   BMI 29.57 kg/m   BP Readings from Last 3 Encounters:  06/30/18 128/72  12/31/17 126/78  11/13/17 140/90    Wt Readings from Last 3 Encounters:  06/30/18 212 lb (96.2 kg)  12/31/17 212 lb (96.2 kg)  11/13/17 213 lb (96.6 kg)    Physical Exam  Constitutional: He is oriented to person, place, and time. He appears well-developed. No distress.  NAD  HENT:  Mouth/Throat: Oropharynx is clear and moist.  Eyes: Pupils are equal, round, and reactive to light. Conjunctivae are normal.  Neck: Normal range of motion. No JVD present. No thyromegaly present.  Cardiovascular: Normal rate, regular rhythm, normal heart sounds and intact distal pulses. Exam reveals no gallop and no friction rub.  No murmur heard. Pulmonary/Chest: Effort normal and breath sounds normal. No respiratory distress. He has no wheezes. He has no rales. He exhibits no tenderness.  Abdominal: Soft. Bowel sounds are normal. He exhibits no distension and no mass. There is no tenderness. There is no rebound and no guarding.  Musculoskeletal: Normal range of motion. He exhibits no edema or tenderness.  Lymphadenopathy:    He has no cervical adenopathy.  Neurological: He is alert and oriented to person, place, and time. He has normal reflexes. No cranial nerve deficit. He exhibits normal muscle tone.  He displays a negative Romberg sign. Coordination and gait normal.  Skin: Skin is warm and dry. No rash noted.  Psychiatric: He has a normal mood and affect. His behavior is normal. Judgment and thought content normal.    Lab Results  Component Value Date   WBC 8.9 07/01/2016   HGB 15.7 07/01/2016   HCT 46.5 07/01/2016   PLT 206.0 07/01/2016   GLUCOSE 113 (H) 07/01/2017   CHOL 195 06/26/2015   TRIG 144.0 06/26/2015   HDL 54.20 06/26/2015   LDLDIRECT 126.5 06/14/2013    LDLCALC 112 (H) 06/26/2015   ALT 14 07/01/2017   AST 14 07/01/2017   NA 138 07/01/2017   K 4.3 07/01/2017   CL 103 07/01/2017   CREATININE 0.86 07/01/2017   BUN 15 07/01/2017   CO2 27 07/01/2017   TSH 1.16 07/01/2016   PSA 1.24 07/01/2016   HGBA1C 7.2 (H) 07/01/2017    Dg Lumbar Spine Complete  Result Date: 10/16/2017 CLINICAL DATA:  Chronic back and bilateral leg pain, no injury EXAM: LUMBAR SPINE - COMPLETE 4+ VIEW COMPARISON:  None. FINDINGS: The lumbar vertebrae are in normal alignment. Intervertebral disc spaces appear relatively normal. No compression deformity is seen. The SI joints appear corticated. Calcifications in the mid lower pelvis most likely are prostatic in origin. Surgical clips are noted in the right upper quadrant from prior cholecystectomy. IMPRESSION: Normal alignment with normal intervertebral disc spaces. No acute abnormality. Electronically Signed   By: Dwyane Dee M.D.   On: 10/16/2017 16:32    Assessment & Plan:   There are no diagnoses linked to this encounter.   No orders of the defined types were placed in this encounter.    Follow-up: No follow-ups on file.  Sonda Primes, MD

## 2018-06-30 NOTE — Assessment & Plan Note (Signed)
On Metformin, Prandin 

## 2018-07-01 ENCOUNTER — Other Ambulatory Visit: Payer: Self-pay | Admitting: Internal Medicine

## 2018-07-01 MED ORDER — REPAGLINIDE 2 MG PO TABS: 2.0000 mg | ORAL_TABLET | Freq: Three times a day (TID) | ORAL | 3 refills | Status: DC

## 2018-07-01 MED ORDER — METFORMIN HCL 1000 MG PO TABS: 1000.0000 mg | ORAL_TABLET | Freq: Two times a day (BID) | ORAL | 3 refills | Status: DC

## 2018-09-20 DIAGNOSIS — Z23 Encounter for immunization: Secondary | ICD-10-CM | POA: Diagnosis not present

## 2019-01-01 ENCOUNTER — Telehealth: Payer: Self-pay | Admitting: *Deleted

## 2019-01-01 NOTE — Telephone Encounter (Signed)
LVM for patient to call back in regards to scheduling AWV with our health coach.  

## 2019-01-04 ENCOUNTER — Ambulatory Visit: Payer: Federal, State, Local not specified - PPO | Admitting: Internal Medicine

## 2019-01-04 ENCOUNTER — Encounter: Payer: Self-pay | Admitting: Internal Medicine

## 2019-01-04 ENCOUNTER — Other Ambulatory Visit (INDEPENDENT_AMBULATORY_CARE_PROVIDER_SITE_OTHER): Payer: Federal, State, Local not specified - PPO

## 2019-01-04 DIAGNOSIS — E119 Type 2 diabetes mellitus without complications: Secondary | ICD-10-CM | POA: Diagnosis not present

## 2019-01-04 DIAGNOSIS — Z0000 Encounter for general adult medical examination without abnormal findings: Secondary | ICD-10-CM

## 2019-01-04 DIAGNOSIS — R350 Frequency of micturition: Secondary | ICD-10-CM

## 2019-01-04 DIAGNOSIS — R030 Elevated blood-pressure reading, without diagnosis of hypertension: Secondary | ICD-10-CM | POA: Diagnosis not present

## 2019-01-04 DIAGNOSIS — R9431 Abnormal electrocardiogram [ECG] [EKG]: Secondary | ICD-10-CM

## 2019-01-04 DIAGNOSIS — N401 Enlarged prostate with lower urinary tract symptoms: Secondary | ICD-10-CM

## 2019-01-04 LAB — CBC WITH DIFFERENTIAL/PLATELET
BASOS ABS: 0 10*3/uL (ref 0.0–0.1)
Basophils Relative: 0.6 % (ref 0.0–3.0)
EOS PCT: 3 % (ref 0.0–5.0)
Eosinophils Absolute: 0.2 10*3/uL (ref 0.0–0.7)
HCT: 49.8 % (ref 39.0–52.0)
HEMOGLOBIN: 17.1 g/dL — AB (ref 13.0–17.0)
LYMPHS ABS: 2.4 10*3/uL (ref 0.7–4.0)
Lymphocytes Relative: 33.3 % (ref 12.0–46.0)
MCHC: 34.3 g/dL (ref 30.0–36.0)
MCV: 94.2 fl (ref 78.0–100.0)
MONOS PCT: 7.8 % (ref 3.0–12.0)
Monocytes Absolute: 0.6 10*3/uL (ref 0.1–1.0)
NEUTROS PCT: 55.3 % (ref 43.0–77.0)
Neutro Abs: 4 10*3/uL (ref 1.4–7.7)
Platelets: 203 10*3/uL (ref 150.0–400.0)
RBC: 5.28 Mil/uL (ref 4.22–5.81)
RDW: 14 % (ref 11.5–15.5)
WBC: 7.2 10*3/uL (ref 4.0–10.5)

## 2019-01-04 LAB — URINALYSIS, ROUTINE W REFLEX MICROSCOPIC
Bilirubin Urine: NEGATIVE
Nitrite: POSITIVE — AB
TOTAL PROTEIN, URINE-UPE24: NEGATIVE
URINE GLUCOSE: 250 — AB
UROBILINOGEN UA: 0.2 (ref 0.0–1.0)
pH: 5.5 (ref 5.0–8.0)

## 2019-01-04 LAB — MICROALBUMIN / CREATININE URINE RATIO
Creatinine,U: 201.9 mg/dL
MICROALB/CREAT RATIO: 1.6 mg/g (ref 0.0–30.0)
Microalb, Ur: 3.2 mg/dL — ABNORMAL HIGH (ref 0.0–1.9)

## 2019-01-04 LAB — HEPATIC FUNCTION PANEL
ALT: 27 U/L (ref 0–53)
AST: 17 U/L (ref 0–37)
Albumin: 4.5 g/dL (ref 3.5–5.2)
Alkaline Phosphatase: 51 U/L (ref 39–117)
Bilirubin, Direct: 0.1 mg/dL (ref 0.0–0.3)
Total Bilirubin: 0.6 mg/dL (ref 0.2–1.2)
Total Protein: 7.7 g/dL (ref 6.0–8.3)

## 2019-01-04 LAB — BASIC METABOLIC PANEL
BUN: 13 mg/dL (ref 6–23)
CALCIUM: 10 mg/dL (ref 8.4–10.5)
CO2: 23 mEq/L (ref 19–32)
CREATININE: 0.98 mg/dL (ref 0.40–1.50)
Chloride: 103 mEq/L (ref 96–112)
GFR: 92.3 mL/min (ref >60.00)
GLUCOSE: 144 mg/dL — AB (ref 70–99)
Potassium: 4 mEq/L (ref 3.5–5.1)
Sodium: 138 mEq/L (ref 135–145)

## 2019-01-04 LAB — LIPID PANEL
CHOL/HDL RATIO: 4
Cholesterol: 199 mg/dL (ref 0–200)
HDL: 52.5 mg/dL (ref >39.00)
LDL Cholesterol: 129 mg/dL — ABNORMAL HIGH (ref 0–99)
NONHDL: 146.47
Triglycerides: 85 mg/dL (ref 0.0–149.0)
VLDL: 17 mg/dL (ref 0.0–40.0)

## 2019-01-04 LAB — TSH: TSH: 1.29 u[IU]/mL (ref 0.35–4.50)

## 2019-01-04 LAB — HEMOGLOBIN A1C: Hgb A1c MFr Bld: 7.4 % — ABNORMAL HIGH (ref 4.6–6.5)

## 2019-01-04 MED ORDER — CYCLOBENZAPRINE HCL 5 MG PO TABS: 5.0000 mg | ORAL_TABLET | Freq: Two times a day (BID) | ORAL | 3 refills | Status: DC | PRN

## 2019-01-04 NOTE — Patient Instructions (Signed)

## 2019-01-04 NOTE — Assessment & Plan Note (Addendum)
We discussed age appropriate health related issues, including available/recomended screening tests and vaccinations. We discussed a need for adhering to healthy diet and exercise. Labs were ordered to be later reviewed . All questions were answered. CT ca score test offered  Dr Retta Dionesahlstedt - PSA q 12 mo Colon is due 2026

## 2019-01-04 NOTE — Progress Notes (Signed)
Subjective:  Patient ID: [name], male    DOB: 09/23/1951  Age: 68 y.o. MRN: [id number]  CC: No chief complaint on file.   HPI [name] presents for a well exam  Outpatient Medications Prior to Visit  Medication Sig Dispense Refill  . aspirin 81 MG EC tablet Take 81 mg by mouth daily.      . Cholecalciferol (VITAMIN D) 2000 units CAPS Take 2,000 Units by mouth daily.     . cyclobenzaprine (FLEXERIL) 5 MG tablet Take 1-2 tablets (5-10 mg total) by mouth 3 times/day as needed-between meals & bedtime for muscle spasms. 60 tablet 3  . loratadine (CLARITIN) 10 MG tablet Take 1 tablet (10 mg total) by mouth daily. 30 tablet 11  . metFORMIN (GLUCOPHAGE) 1000 MG tablet Take 1 tablet (1,000 mg total) by mouth 2 (two) times daily with a meal. 180 tablet 3  . Omega-3 Fatty Acids (FISH OIL) 1000 MG CAPS Take 1 capsule by mouth 2 (two) times daily.      . repaglinide (PRANDIN) 2 MG tablet Take 1 tablet (2 mg total) by mouth 3 (three) times daily before meals. 270 tablet 3   No facility-administered medications prior to visit.     ROS: Review of Systems  Constitutional: Negative for appetite change, fatigue and unexpected weight change.  HENT: Negative for congestion, nosebleeds, sneezing, sore throat and trouble swallowing.   Eyes: Negative for itching and visual disturbance.  Respiratory: Negative for cough.   Cardiovascular: Negative for chest pain, palpitations and leg swelling.  Gastrointestinal: Negative for abdominal distention, blood in stool, diarrhea and nausea.  Genitourinary: Negative for frequency and hematuria.  Musculoskeletal: Negative for back pain, gait problem, joint swelling and neck pain.  Skin: Negative for rash.  Neurological: Negative for dizziness, tremors, speech difficulty and weakness.  Psychiatric/Behavioral: Negative for agitation, dysphoric mood, sleep disturbance and suicidal ideas. The patient is not nervous/anxious.     Objective:  BP 126/72 (BP  Location: Left Arm, Patient Position: Sitting, Cuff Size: Large)   Pulse 87   Temp 98 F (36.7 C) (Oral)   Ht 5\' 11"  (1.803 m)   Wt 212 lb (96.2 kg)   SpO2 99%   BMI 29.57 kg/m   BP Readings from Last 3 Encounters:  01/04/19 126/72  06/30/18 128/72  12/31/17 126/78    Wt Readings from Last 3 Encounters:  01/04/19 212 lb (96.2 kg)  06/30/18 212 lb (96.2 kg)  12/31/17 212 lb (96.2 kg)    Physical Exam Constitutional:      General: He is not in acute distress.    Appearance: He is well-developed.     Comments: NAD  Eyes:     Conjunctiva/sclera: Conjunctivae normal.     Pupils: Pupils are equal, round, and reactive to light.  Neck:     Musculoskeletal: Normal range of motion.     Thyroid: No thyromegaly.     Vascular: No JVD.  Cardiovascular:     Rate and Rhythm: Normal rate and regular rhythm.     Heart sounds: Normal heart sounds. No murmur. No friction rub. No gallop.   Pulmonary:     Effort: Pulmonary effort is normal. No respiratory distress.     Breath sounds: Normal breath sounds. No wheezing or rales.  Chest:     Chest wall: No tenderness.  Abdominal:     General: Bowel sounds are normal. There is no distension.     Palpations: Abdomen is soft. There is no mass.  Tenderness: There is no abdominal tenderness. There is no guarding or rebound.  Musculoskeletal: Normal range of motion.        General: No tenderness.  Lymphadenopathy:     Cervical: No cervical adenopathy.  Skin:    General: Skin is warm and dry.     Findings: No rash.  Neurological:     Mental Status: He is alert and oriented to person, place, and time.     Cranial Nerves: No cranial nerve deficit.     Motor: No abnormal muscle tone.     Coordination: Coordination normal.     Gait: Gait normal.     Deep Tendon Reflexes: Reflexes are normal and symmetric. Reflexes normal.  Psychiatric:        Behavior: Behavior normal.        Thought Content: Thought content normal.        Judgment:  Judgment normal.    Dr Retta Diones - rectal exam q 12 mo  Lab Results  Component Value Date   WBC 8.9 06/30/2018   HGB 15.8 06/30/2018   HCT 46.6 06/30/2018   PLT 185.0 06/30/2018   GLUCOSE 117 (H) 06/30/2018   CHOL 195 06/26/2015   TRIG 144.0 06/26/2015   HDL 54.20 06/26/2015   LDLDIRECT 126.5 06/14/2013   LDLCALC 112 (H) 06/26/2015   ALT 14 07/01/2017   AST 14 07/01/2017   NA 139 06/30/2018   K 4.1 06/30/2018   CL 103 06/30/2018   CREATININE 0.92 06/30/2018   BUN 14 06/30/2018   CO2 26 06/30/2018   TSH 1.16 07/01/2016   PSA 1.24 07/01/2016   HGBA1C 7.8 (H) 06/30/2018    Dg Lumbar Spine Complete  Result Date: 10/16/2017 CLINICAL DATA:  Chronic back and bilateral leg pain, no injury EXAM: LUMBAR SPINE - COMPLETE 4+ VIEW COMPARISON:  None. FINDINGS: The lumbar vertebrae are in normal alignment. Intervertebral disc spaces appear relatively normal. No compression deformity is seen. The SI joints appear corticated. Calcifications in the mid lower pelvis most likely are prostatic in origin. Surgical clips are noted in the right upper quadrant from prior cholecystectomy. IMPRESSION: Normal alignment with normal intervertebral disc spaces. No acute abnormality. Electronically Signed   By: Dwyane Dee M.D.   On: 10/16/2017 16:32    Assessment & Plan:   There are no diagnoses linked to this encounter.   No orders of the defined types were placed in this encounter.    Follow-up: No follow-ups on file.  Sonda Primes, MD

## 2019-01-04 NOTE — Assessment & Plan Note (Addendum)
On Metformin, Prandin  cardiac CT scan for calcium scoring was offered

## 2019-01-04 NOTE — Assessment & Plan Note (Signed)
BP Readings from Last 3 Encounters:  01/04/19 126/72  06/30/18 128/72  12/31/17 126/78

## 2019-01-04 NOTE — Assessment & Plan Note (Signed)
cardiac CT scan for calcium scoring was offered 

## 2019-01-06 ENCOUNTER — Other Ambulatory Visit: Payer: Self-pay | Admitting: Internal Medicine

## 2019-01-06 DIAGNOSIS — E119 Type 2 diabetes mellitus without complications: Secondary | ICD-10-CM

## 2019-01-06 DIAGNOSIS — D751 Secondary polycythemia: Secondary | ICD-10-CM

## 2019-02-25 DIAGNOSIS — K080 Exfoliation of teeth due to systemic causes: Secondary | ICD-10-CM | POA: Diagnosis not present

## 2019-03-13 ENCOUNTER — Telehealth: Payer: Federal, State, Local not specified - PPO | Admitting: Nurse Practitioner

## 2019-03-13 DIAGNOSIS — J029 Acute pharyngitis, unspecified: Secondary | ICD-10-CM | POA: Diagnosis not present

## 2019-03-13 NOTE — Progress Notes (Signed)
We are sorry that you are not feeling well.  Here is how we plan to help!  Your symptoms indicate a likely viral infection (Pharyngitis).   Pharyngitis is inflammation in the back of the throat which can cause a sore throat, scratchiness and sometimes difficulty swallowing.   Pharyngitis is typically caused by a respiratory virus and will just run its course.  Please keep in mind that your symptoms could last up to 10 days.  For throat pain, we recommend over the counter oral pain relief medications such as acetaminophen or aspirin, or anti-inflammatory medications such as ibuprofen or naproxen sodium.  Topical treatments such as oral throat lozenges or sprays may be used as needed.  Avoid close contact with loved ones, especially the very young and elderly.  Remember to wash your hands thoroughly throughout the day as this is the number one way to prevent the spread of infection and wipe down door knobs and counters with disinfectant.  After careful review of your answers, I would not recommend and antibiotic for your condition.  Antibiotics should not be used to treat conditions that we suspect are caused by viruses like the virus that causes the common cold or flu. However, some people can have Strep with atypical symptoms. You may need formal testing in clinic or office to confirm if your symptoms continue or worsen.  Providers prescribe antibiotics to treat infections caused by bacteria. Antibiotics are very powerful in treating bacterial infections when they are used properly.  To maintain their effectiveness, they should be used only when necessary.  Overuse of antibiotics has resulted in the development of super bugs that are resistant to treatment!    Home Care:  Only take medications as instructed by your medical team.  Do not drink alcohol while taking these medications.  A steam or ultrasonic humidifier can help congestion.  You can place a towel over your head and breathe in the steam from  hot water coming from a faucet.  Avoid close contacts especially the very young and the elderly.  Cover your mouth when you cough or sneeze.  Always remember to wash your hands.  Get Help Right Away If:  You develop worsening fever or throat pain.  You develop a severe head ache or visual changes.  Your symptoms persist after you have completed your treatment plan.  Make sure you  Understand these instructions.  Will watch your condition.  Will get help right away if you are not doing well or get worse.  Your e-visit answers were reviewed by a board certified advanced clinical practitioner to complete your personal care plan.  Depending on the condition, your plan could have included both over the counter or prescription medications.  If there is a problem please reply  once you have received a response from your provider.  Your safety is important to us.  If you have drug allergies check your prescription carefully.    You can use MyChart to ask questions about todays visit, request a non-urgent call back, or ask for a work or school excuse for 24 hours related to this e-Visit. If it has been greater than 24 hours you will need to follow up with your provider, or enter a new e-Visit to address those concerns.  You will get an e-mail in the next two days asking about your experience.  I hope that your e-visit has been valuable and will speed your recovery. Thank you for using e-visits.   5 minutes spent reviewing   and documenting in chart.  

## 2019-03-26 DIAGNOSIS — M722 Plantar fascial fibromatosis: Secondary | ICD-10-CM | POA: Diagnosis not present

## 2019-03-26 DIAGNOSIS — M25572 Pain in left ankle and joints of left foot: Secondary | ICD-10-CM | POA: Diagnosis not present

## 2019-03-26 DIAGNOSIS — M7732 Calcaneal spur, left foot: Secondary | ICD-10-CM | POA: Diagnosis not present

## 2019-04-09 ENCOUNTER — Other Ambulatory Visit: Payer: Self-pay | Admitting: Internal Medicine

## 2019-04-09 MED ORDER — METFORMIN HCL 1000 MG PO TABS: 1000.0000 mg | ORAL_TABLET | Freq: Two times a day (BID) | ORAL | 0 refills | Status: DC

## 2019-04-09 NOTE — Telephone Encounter (Signed)
Requested Prescriptions  Pending Prescriptions Disp Refills  . metFORMIN (GLUCOPHAGE) 1000 MG tablet 180 tablet 0    Sig: Take 1 tablet (1,000 mg total) by mouth 2 (two) times daily with a meal.     Endocrinology:  Diabetes - Biguanides Passed - 04/09/2019  2:52 PM      Passed - Cr in normal range and within 360 days    Creatinine, Ser  Date Value Ref Range Status  01/04/2019 0.98 0.40 - 1.50 mg/dL Final         Passed - HBA1C is between 0 and 7.9 and within 180 days    Hgb A1c MFr Bld  Date Value Ref Range Status  01/04/2019 7.4 (H) 4.6 - 6.5 % Final    Comment:    Glycemic Control Guidelines for People with Diabetes:Non Diabetic:  <6%Goal of Therapy: <7%Additional Action Suggested:  >8%          Passed - eGFR in normal range and within 360 days    GFR calc Af Amer  Date Value Ref Range Status  12/29/2008 112 mL/min Final   GFR calc non Af Amer  Date Value Ref Range Status  07/05/2010 108.46 >60 mL/min Final   GFR  Date Value Ref Range Status  01/04/2019 92.30 >60.00 mL/min Final         Passed - Valid encounter within last 6 months    Recent Outpatient Visits          3 months ago Type 2 diabetes mellitus without complication, without long-term current use of insulin ([place])   [facility] Plotnikov, Evie Lacks, MD   9 months ago Type 2 diabetes mellitus without complication, without long-term current use of insulin ([place])   [facility] Plot[facility], Evie Lacks, MD   1 year ago Elevated BP without diagnosis of hypertension   [facility] Plot[facility], Evie Lacks, MD   1 year ago Back pain, unspecified back location, unspecified back pain laterality, unspecified chronicity   [place], [place], DO   1 year ago Back pain, unspecified back location, unspecified back pain laterality, unspecified chronicity   [place], Olevia Bowens, DO       Future Appointments            In 2 months Plotnikov, Evie Lacks, MD [place], [place] [facility]

## 2019-06-11 DIAGNOSIS — N400 Enlarged prostate without lower urinary tract symptoms: Secondary | ICD-10-CM | POA: Diagnosis not present

## 2019-06-21 DIAGNOSIS — N400 Enlarged prostate without lower urinary tract symptoms: Secondary | ICD-10-CM | POA: Diagnosis not present

## 2019-07-05 ENCOUNTER — Encounter: Payer: Self-pay | Admitting: Internal Medicine

## 2019-07-05 ENCOUNTER — Other Ambulatory Visit: Payer: Self-pay

## 2019-07-05 ENCOUNTER — Ambulatory Visit (INDEPENDENT_AMBULATORY_CARE_PROVIDER_SITE_OTHER): Payer: Federal, State, Local not specified - PPO | Admitting: Internal Medicine

## 2019-07-05 ENCOUNTER — Other Ambulatory Visit (INDEPENDENT_AMBULATORY_CARE_PROVIDER_SITE_OTHER): Payer: Federal, State, Local not specified - PPO

## 2019-07-05 DIAGNOSIS — N411 Chronic prostatitis: Secondary | ICD-10-CM

## 2019-07-05 DIAGNOSIS — R350 Frequency of micturition: Secondary | ICD-10-CM

## 2019-07-05 DIAGNOSIS — D751 Secondary polycythemia: Secondary | ICD-10-CM | POA: Diagnosis not present

## 2019-07-05 DIAGNOSIS — E119 Type 2 diabetes mellitus without complications: Secondary | ICD-10-CM | POA: Diagnosis not present

## 2019-07-05 DIAGNOSIS — N401 Enlarged prostate with lower urinary tract symptoms: Secondary | ICD-10-CM | POA: Diagnosis not present

## 2019-07-05 LAB — CBC WITH DIFFERENTIAL/PLATELET
Basophils Absolute: 0.1 10*3/uL (ref 0.0–0.1)
Basophils Relative: 0.9 % (ref 0.0–3.0)
Eosinophils Absolute: 0.3 10*3/uL (ref 0.0–0.7)
Eosinophils Relative: 3.2 % (ref 0.0–5.0)
HCT: 46.1 % (ref 39.0–52.0)
Hemoglobin: 15.5 g/dL (ref 13.0–17.0)
Lymphocytes Relative: 33.3 % (ref 12.0–46.0)
Lymphs Abs: 2.7 10*3/uL (ref 0.7–4.0)
MCHC: 33.6 g/dL (ref 30.0–36.0)
MCV: 96.3 fl (ref 78.0–100.0)
Monocytes Absolute: 0.8 10*3/uL (ref 0.1–1.0)
Monocytes Relative: 9.5 % (ref 3.0–12.0)
Neutro Abs: 4.3 10*3/uL (ref 1.4–7.7)
Neutrophils Relative %: 53.1 % (ref 43.0–77.0)
Platelets: 190 10*3/uL (ref 150.0–400.0)
RBC: 4.78 Mil/uL (ref 4.22–5.81)
RDW: 13.7 % (ref 11.5–15.5)
WBC: 8.1 10*3/uL (ref 4.0–10.5)

## 2019-07-05 LAB — BASIC METABOLIC PANEL
BUN: 13 mg/dL (ref 6–23)
CO2: 24 mEq/L (ref 19–32)
Calcium: 9.3 mg/dL (ref 8.4–10.5)
Chloride: 104 mEq/L (ref 96–112)
Creatinine, Ser: 0.93 mg/dL (ref 0.40–1.50)
GFR: 97.9 mL/min (ref >60.00)
Glucose, Bld: 133 mg/dL — ABNORMAL HIGH (ref 70–99)
Potassium: 3.7 mEq/L (ref 3.5–5.1)
Sodium: 140 mEq/L (ref 135–145)

## 2019-07-05 LAB — HEMOGLOBIN A1C: Hgb A1c MFr Bld: 7.7 % — ABNORMAL HIGH (ref 4.6–6.5)

## 2019-07-05 MED ORDER — METFORMIN HCL 1000 MG PO TABS: 1000.0000 mg | ORAL_TABLET | Freq: Two times a day (BID) | ORAL | 3 refills | Status: DC

## 2019-07-05 MED ORDER — REPAGLINIDE 2 MG PO TABS: 2.0000 mg | ORAL_TABLET | Freq: Three times a day (TID) | ORAL | 3 refills | Status: DC

## 2019-07-05 NOTE — Assessment & Plan Note (Signed)
On Metformin, Prandin 

## 2019-07-05 NOTE — Patient Instructions (Signed)
These suggestions will probably help you to improve your metabolism if you are not overweight and to lose weight if you are overweight: 1.  Reduce your consumption of sugars and starches.  Eliminate high fructose corn syrup from your diet.  Reduce your consumption of processed foods.  For desserts try to have seasonal fruits, berries with with green, nuts, cheeses or dark chocolate with more than 70% cacao. 2.  Do not snack 3.  You do not have to eat breakfast.  If you choose to have breakfast-eat plain greek yogurt, eggs, oatmeal (without sugar) 4.  Drink water, freshly brewed unsweetened tea (green, black or herbal) or coffee.  Do not drink sodas including diet sodas , juices, beverages sweetened with artificial sweeteners. 5.  Reduce your consumption of refined grains. 6.  Avoid protein drinks such as Optifast, Slim fast etc. Eat chicken, fish, meat, dairy and beans for your sources of protein 7.  Natural unprocessed fats like cold pressed virgin olive oil, butter, coconut oil are good for you.  Eat avocados 8.  Increase your consumption of fiber.  Fruits, berries, vegetables, whole grains, flaxseeds, Chia seeds, beans, popcorn, nuts, oatmeal are good sources of fiber 9.  Use vinegar in your diet, i.e. apple cider vinegar, red wine or balsamic vinegar 10.  You can try fasting.  For example you can skip breakfast and lunch every other day (24-hour fast) 11.  Stress reduction, good night sleep, relaxation, meditation, yoga and other physical activity is likely to help you to maintain low weight too. 12.  If you drink alcohol, limit your alcohol intake to no more than 2 drinks a day.   Mediterranean diet is good for you.  The Mediterranean diet is a way of eating based on the traditional cuisine of countries bordering the [place]. While there is no single definition of the Mediterranean diet, it is typically high in vegetables, fruits, whole grains, beans, nut and seeds, and olive  oil. The main components of Mediterranean diet include: . Daily consumption of vegetables, fruits, whole grains and healthy fats  . Weekly intake of fish, poultry, beans and eggs  . Moderate portions of dairy products  . Limited intake of red meat Other important elements of the Mediterranean diet are sharing meals with family and friends, enjoying a glass of red wine and being physically active. Health benefits of a Mediterranean diet: A traditional Mediterranean diet consisting of large quantities of fresh fruits and vegetables, nuts, fish and olive oil-coupled with physical activity-can reduce your risk of serious mental and physical health problems by: Preventing heart disease and strokes. Following a Mediterranean diet limits your intake of refined breads, processed foods, and red meat, and encourages drinking red wine instead of hard liquor-all factors that can help prevent heart disease and stroke. Keeping you agile. If you're an older adult, the nutrients gained with a Mediterranean diet may reduce your risk of developing muscle weakness and other signs of frailty by about 70 percent. Reducing the risk of Alzheimer's. Research suggests that the Mediterranean diet may improve cholesterol, blood sugar levels, and overall blood vessel health, which in turn may reduce your risk of Alzheimer's disease or dementia. Halving the risk of Parkinson's disease. The high levels of antioxidants in the Mediterranean diet can prevent cells from undergoing a damaging process called oxidative stress, thereby cutting the risk of Parkinson's disease in half. Increasing longevity. By reducing your risk of developing heart disease or cancer with the Mediterranean diet, you're reducing your risk   of death at any age by 20%. Protecting against type 2 diabetes. A Mediterranean diet is rich in fiber which digests slowly, prevents huge swings in blood sugar, and can help you maintain a healthy weight.    Cabbage soup  recipe that will not make you gain weight: Take 1 small head of CABG, 1 average pack of celery, 4 green peppers, 4 onions, 2 cans diced tomatoes (they are not available without salt), salt and spices to taste.  Chop cabbage, celery, peppers and onions.  And tomatoes and 2-2.5 liters (2.5 quarts) of water so that it would just cover the vegetables.  Bring to boil.  Add spices and salt.  Turn heat to low/medium and simmer for 20-25 minutes.  Naturally, you can make a smaller batch and change some of the ingredients.  Cardiac CT calcium scoring test $150   Computed tomography, more commonly known as a CT or CAT scan, is a diagnostic medical imaging test. Like traditional x-rays, it produces multiple images or pictures of the inside of the body. The cross-sectional images generated during a CT scan can be reformatted in multiple planes. They can even generate three-dimensional images. These images can be viewed on a computer monitor, printed on film or by a 3D printer, or transferred to a CD or DVD. CT images of internal organs, bones, soft tissue and blood vessels provide greater detail than traditional x-rays, particularly of soft tissues and blood vessels. A cardiac CT scan for coronary calcium is a non-invasive way of obtaining information about the presence, location and extent of calcified plaque in the coronary arteries-the vessels that supply oxygen-containing blood to the heart muscle. Calcified plaque results when there is a build-up of fat and other substances under the inner layer of the artery. This material can calcify which signals the presence of atherosclerosis, a disease of the vessel wall, also called coronary artery disease (CAD). People with this disease have an increased risk for heart attacks. In addition, over time, progression of plaque build up (CAD) can narrow the arteries or even close off blood flow to the heart. The result may be chest pain, sometimes called "angina," or a heart  attack. Because calcium is a marker of CAD, the amount of calcium detected on a cardiac CT scan is a helpful prognostic tool. The findings on cardiac CT are expressed as a calcium score. Another name for this test is coronary artery calcium scoring.  What are some common uses of the procedure? The goal of cardiac CT scan for calcium scoring is to determine if CAD is present and to what extent, even if there are no symptoms. It is a screening study that may be recommended by a physician for patients with risk factors for CAD but no clinical symptoms. The major risk factors for CAD are: . high blood cholesterol levels  . family history of heart attacks  . diabetes  . high blood pressure  . cigarette smoking  . overweight or obese  . physical inactivity   A negative cardiac CT scan for calcium scoring shows no calcification within the coronary arteries. This suggests that CAD is absent or so minimal it cannot be seen by this technique. The chance of having a heart attack over the next two to five years is very low under these circumstances. A positive test means that CAD is present, regardless of whether or not the patient is experiencing any symptoms. The amount of calcification-expressed as the calcium score-may help to predict the likelihood   of a myocardial infarction (heart attack) in the coming years and helps your medical doctor or cardiologist decide whether the patient may need to take preventive medicine or undertake other measures such as diet and exercise to lower the risk for heart attack. The extent of CAD is graded according to your calcium score:  Calcium Score  Presence of CAD (coronary artery disease)  0 No evidence of CAD   1-10 Minimal evidence of CAD  11-100 Mild evidence of CAD  101-400 Moderate evidence of CAD  Over 400 Extensive evidence of CAD     

## 2019-07-05 NOTE — Assessment & Plan Note (Addendum)
Last PSA was 0.58

## 2019-07-05 NOTE — Assessment & Plan Note (Signed)
UA F/u w/Urology

## 2019-07-05 NOTE — Progress Notes (Signed)
Subjective:  Patient ID: [name], male    DOB: Jun 17, 1951  Age: 68 y.o. MRN: [id number]  CC: No chief complaint on file.   HPI [name] presents for DM, HTN, allergies f/u. Last PSA was 0.58  Outpatient Medications Prior to Visit  Medication Sig Dispense Refill  . aspirin 81 MG EC tablet Take 81 mg by mouth daily.      . Cholecalciferol (VITAMIN D) 2000 units CAPS Take 2,000 Units by mouth daily.     . cyclobenzaprine (FLEXERIL) 5 MG tablet Take 1-2 tablets (5-10 mg total) by mouth 3 times/day as needed-between meals & bedtime for muscle spasms. 60 tablet 3  . loratadine (CLARITIN) 10 MG tablet Take 1 tablet (10 mg total) by mouth daily. 30 tablet 11  . metFORMIN (GLUCOPHAGE) 1000 MG tablet Take 1 tablet (1,000 mg total) by mouth 2 (two) times daily with a meal. 180 tablet 0  . Omega-3 Fatty Acids (FISH OIL) 1000 MG CAPS Take 1 capsule by mouth 2 (two) times daily.      . repaglinide (PRANDIN) 2 MG tablet Take 1 tablet (2 mg total) by mouth 3 (three) times daily before meals. 270 tablet 3   No facility-administered medications prior to visit.     ROS: Review of Systems  Constitutional: Negative for appetite change, fatigue and unexpected weight change.  HENT: Negative for congestion, nosebleeds, sneezing, sore throat and trouble swallowing.   Eyes: Negative for itching and visual disturbance.  Respiratory: Negative for cough.   Cardiovascular: Negative for chest pain, palpitations and leg swelling.  Gastrointestinal: Negative for abdominal distention, blood in stool, diarrhea and nausea.  Genitourinary: Negative for frequency and hematuria.  Musculoskeletal: Negative for back pain, gait problem, joint swelling and neck pain.  Skin: Negative for rash.  Neurological: Negative for dizziness, tremors, speech difficulty and weakness.  Psychiatric/Behavioral: Negative for agitation, dysphoric mood, sleep disturbance and suicidal ideas. The patient is not nervous/anxious.     Objective:  BP 130/70 (BP Location: Left Arm, Patient Position: Sitting, Cuff Size: Normal)   Pulse 72   Temp 98.4 F (36.9 C) (Oral)   Ht 5\' 11"  (1.803 m)   Wt 211 lb (95.7 kg)   SpO2 96%   BMI 29.43 kg/m   BP Readings from Last 3 Encounters:  07/05/19 130/70  01/04/19 126/72  06/30/18 128/72    Wt Readings from Last 3 Encounters:  07/05/19 211 lb (95.7 kg)  01/04/19 212 lb (96.2 kg)  06/30/18 212 lb (96.2 kg)    Physical Exam Constitutional:      General: He is not in acute distress.    Appearance: He is well-developed.     Comments: NAD  Eyes:     Conjunctiva/sclera: Conjunctivae normal.     Pupils: Pupils are equal, round, and reactive to light.  Neck:     Musculoskeletal: Normal range of motion.     Thyroid: No thyromegaly.     Vascular: No JVD.  Cardiovascular:     Rate and Rhythm: Normal rate and regular rhythm.     Heart sounds: Normal heart sounds. No murmur. No friction rub. No gallop.   Pulmonary:     Effort: Pulmonary effort is normal. No respiratory distress.     Breath sounds: Normal breath sounds. No wheezing or rales.  Chest:     Chest wall: No tenderness.  Abdominal:     General: Bowel sounds are normal. There is no distension.     Palpations: Abdomen is soft. There  is no mass.     Tenderness: There is no abdominal tenderness. There is no guarding or rebound.  Musculoskeletal: Normal range of motion.        General: No tenderness.  Lymphadenopathy:     Cervical: No cervical adenopathy.  Skin:    General: Skin is warm and dry.     Findings: No rash.  Neurological:     Mental Status: He is alert and oriented to person, place, and time.     Cranial Nerves: No cranial nerve deficit.     Motor: No abnormal muscle tone.     Coordination: Coordination normal.     Gait: Gait normal.     Deep Tendon Reflexes: Reflexes are normal and symmetric.  Psychiatric:        Behavior: Behavior normal.        Thought Content: Thought content normal.         Judgment: Judgment normal.     Lab Results  Component Value Date   WBC 7.2 01/04/2019   HGB 17.1 (H) 01/04/2019   HCT 49.8 01/04/2019   PLT 203.0 01/04/2019   GLUCOSE 144 (H) 01/04/2019   CHOL 199 01/04/2019   TRIG 85.0 01/04/2019   HDL 52.50 01/04/2019   LDLDIRECT 126.5 06/14/2013   LDLCALC 129 (H) 01/04/2019   ALT 27 01/04/2019   AST 17 01/04/2019   NA 138 01/04/2019   K 4.0 01/04/2019   CL 103 01/04/2019   CREATININE 0.98 01/04/2019   BUN 13 01/04/2019   CO2 23 01/04/2019   TSH 1.29 01/04/2019   PSA 1.24 07/01/2016   HGBA1C 7.4 (H) 01/04/2019   MICROALBUR 3.2 (H) 01/04/2019    Dg Lumbar Spine Complete  Result Date: 10/16/2017 CLINICAL DATA:  Chronic back and bilateral leg pain, no injury EXAM: LUMBAR SPINE - COMPLETE 4+ VIEW COMPARISON:  None. FINDINGS: The lumbar vertebrae are in normal alignment. Intervertebral disc spaces appear relatively normal. No compression deformity is seen. The SI joints appear corticated. Calcifications in the mid lower pelvis most likely are prostatic in origin. Surgical clips are noted in the right upper quadrant from prior cholecystectomy. IMPRESSION: Normal alignment with normal intervertebral disc spaces. No acute abnormality. Electronically Signed   By: Dwyane DeePaul  Barry M.D.   On: 10/16/2017 16:32    Assessment & Plan:   There are no diagnoses linked to this encounter.   No orders of the defined types were placed in this encounter.    Follow-up: No follow-ups on file.  [name] , MD

## 2019-07-07 ENCOUNTER — Other Ambulatory Visit: Payer: Self-pay | Admitting: Internal Medicine

## 2019-07-07 DIAGNOSIS — E119 Type 2 diabetes mellitus without complications: Secondary | ICD-10-CM

## 2019-09-08 ENCOUNTER — Ambulatory Visit: Payer: Federal, State, Local not specified - PPO

## 2019-09-09 ENCOUNTER — Other Ambulatory Visit: Payer: Self-pay

## 2019-09-09 ENCOUNTER — Ambulatory Visit (INDEPENDENT_AMBULATORY_CARE_PROVIDER_SITE_OTHER): Payer: Federal, State, Local not specified - PPO

## 2019-09-09 DIAGNOSIS — Z23 Encounter for immunization: Secondary | ICD-10-CM

## 2020-01-06 ENCOUNTER — Ambulatory Visit (INDEPENDENT_AMBULATORY_CARE_PROVIDER_SITE_OTHER): Payer: Federal, State, Local not specified - PPO | Admitting: Internal Medicine

## 2020-01-06 ENCOUNTER — Encounter: Payer: Self-pay | Admitting: Internal Medicine

## 2020-01-06 ENCOUNTER — Other Ambulatory Visit: Payer: Self-pay

## 2020-01-06 DIAGNOSIS — L309 Dermatitis, unspecified: Secondary | ICD-10-CM

## 2020-01-06 DIAGNOSIS — Z0000 Encounter for general adult medical examination without abnormal findings: Secondary | ICD-10-CM

## 2020-01-06 DIAGNOSIS — E119 Type 2 diabetes mellitus without complications: Secondary | ICD-10-CM

## 2020-01-06 DIAGNOSIS — E785 Hyperlipidemia, unspecified: Secondary | ICD-10-CM

## 2020-01-06 LAB — CBC WITH DIFFERENTIAL/PLATELET
Basophils Absolute: 0.1 10*3/uL (ref 0.0–0.1)
Basophils Relative: 0.6 % (ref 0.0–3.0)
Eosinophils Absolute: 0.2 10*3/uL (ref 0.0–0.7)
Eosinophils Relative: 2.9 % (ref 0.0–5.0)
HCT: 47.7 % (ref 39.0–52.0)
Hemoglobin: 16 g/dL (ref 13.0–17.0)
Lymphocytes Relative: 34 % (ref 12.0–46.0)
Lymphs Abs: 2.7 10*3/uL (ref 0.7–4.0)
MCHC: 33.6 g/dL (ref 30.0–36.0)
MCV: 95.3 fl (ref 78.0–100.0)
Monocytes Absolute: 0.6 10*3/uL (ref 0.1–1.0)
Monocytes Relative: 7.5 % (ref 3.0–12.0)
Neutro Abs: 4.3 10*3/uL (ref 1.4–7.7)
Neutrophils Relative %: 55 % (ref 43.0–77.0)
Platelets: 179 10*3/uL (ref 150.0–400.0)
RBC: 5 Mil/uL (ref 4.22–5.81)
RDW: 13.9 % (ref 11.5–15.5)
WBC: 7.9 10*3/uL (ref 4.0–10.5)

## 2020-01-06 LAB — BASIC METABOLIC PANEL
BUN: 14 mg/dL (ref 6–23)
CO2: 25 mEq/L (ref 19–32)
Calcium: 9.7 mg/dL (ref 8.4–10.5)
Chloride: 103 mEq/L (ref 96–112)
Creatinine, Ser: 0.86 mg/dL (ref 0.40–1.50)
GFR: 106.99 mL/min (ref >60.00)
Glucose, Bld: 127 mg/dL — ABNORMAL HIGH (ref 70–99)
Potassium: 4 mEq/L (ref 3.5–5.1)
Sodium: 138 mEq/L (ref 135–145)

## 2020-01-06 LAB — LIPID PANEL
Cholesterol: 191 mg/dL (ref 0–200)
HDL: 51.6 mg/dL (ref >39.00)
NonHDL: 139.46
Total CHOL/HDL Ratio: 4
Triglycerides: 237 mg/dL — ABNORMAL HIGH (ref 0.0–149.0)
VLDL: 47.4 mg/dL — ABNORMAL HIGH (ref 0.0–40.0)

## 2020-01-06 LAB — HEPATIC FUNCTION PANEL
ALT: 28 U/L (ref 0–53)
AST: 22 U/L (ref 0–37)
Albumin: 4.6 g/dL (ref 3.5–5.2)
Alkaline Phosphatase: 57 U/L (ref 39–117)
Bilirubin, Direct: 0.1 mg/dL (ref 0.0–0.3)
Total Bilirubin: 0.4 mg/dL (ref 0.2–1.2)
Total Protein: 7.6 g/dL (ref 6.0–8.3)

## 2020-01-06 LAB — HEMOGLOBIN A1C: Hgb A1c MFr Bld: 8.3 % — ABNORMAL HIGH (ref 4.6–6.5)

## 2020-01-06 LAB — TSH: TSH: 0.88 u[IU]/mL (ref 0.35–4.50)

## 2020-01-06 MED ORDER — CYCLOBENZAPRINE HCL 5 MG PO TABS: 5.0000 mg | ORAL_TABLET | Freq: Two times a day (BID) | ORAL | 3 refills | Status: DC | PRN

## 2020-01-06 MED ORDER — REPAGLINIDE 2 MG PO TABS: 2.0000 mg | ORAL_TABLET | Freq: Three times a day (TID) | ORAL | 3 refills | Status: DC

## 2020-01-06 MED ORDER — TRIAMCINOLONE ACETONIDE 0.1 % EX OINT: 1.0000 "application " | TOPICAL_OINTMENT | Freq: Three times a day (TID) | CUTANEOUS | 2 refills | Status: DC

## 2020-01-06 MED ORDER — METFORMIN HCL 1000 MG PO TABS: 1000.0000 mg | ORAL_TABLET | Freq: Two times a day (BID) | ORAL | 3 refills | Status: DC

## 2020-01-06 NOTE — Assessment & Plan Note (Addendum)
On Metformin, Prandin  cardiac CT scan for calcium scoring was offered 1/20 Consider therapy adjustment and/or endocrinology consultation if A1c is worse

## 2020-01-06 NOTE — Assessment & Plan Note (Addendum)
We discussed age appropriate health related issues, including available/recomended screening tests and vaccinations. We discussed a need for adhering to healthy diet and exercise. Labs were ordered to be later reviewed . All questions were answered. Dr Retta Diones - PSA q 12 mo Colon is due 2026

## 2020-01-06 NOTE — Progress Notes (Signed)
Subjective:  Patient ID: [name], male    DOB: 1951-11-17  Age: 69 y.o. MRN: [id number]  CC: No chief complaint on file.   HPI [name] presents for a well exam Follow-up on diabetes, dyslipidemia  Outpatient Medications Prior to Visit  Medication Sig Dispense Refill  . aspirin 81 MG EC tablet Take 81 mg by mouth daily.      . Cholecalciferol (VITAMIN D) 2000 units CAPS Take 2,000 Units by mouth daily.     . cyclobenzaprine (FLEXERIL) 5 MG tablet Take 1-2 tablets (5-10 mg total) by mouth 3 times/day as needed-between meals & bedtime for muscle spasms. 60 tablet 3  . metFORMIN (GLUCOPHAGE) 1000 MG tablet Take 1 tablet (1,000 mg total) by mouth 2 (two) times daily with a meal. 180 tablet 3  . Omega-3 Fatty Acids (FISH OIL) 1000 MG CAPS Take 1 capsule by mouth 2 (two) times daily.      . repaglinide (PRANDIN) 2 MG tablet Take 1 tablet (2 mg total) by mouth 3 (three) times daily before meals. 270 tablet 3  . loratadine (CLARITIN) 10 MG tablet Take 1 tablet (10 mg total) by mouth daily. (Patient not taking: Reported on 01/06/2020) 30 tablet 11   No facility-administered medications prior to visit.    ROS: Review of Systems  Constitutional: Negative for appetite change, fatigue and unexpected weight change.  HENT: Negative for congestion, nosebleeds, sneezing, sore throat and trouble swallowing.   Eyes: Negative for itching and visual disturbance.  Respiratory: Negative for cough.   Cardiovascular: Negative for chest pain, palpitations and leg swelling.  Gastrointestinal: Negative for abdominal distention, blood in stool, diarrhea and nausea.  Genitourinary: Negative for frequency and hematuria.  Musculoskeletal: Negative for back pain, gait problem, joint swelling and neck pain.  Skin: Negative for rash.  Neurological: Negative for dizziness, tremors, speech difficulty and weakness.  Psychiatric/Behavioral: Negative for agitation, dysphoric mood, sleep disturbance and  suicidal ideas. The patient is not nervous/anxious.     Objective:  BP 136/88 (BP Location: Left Arm, Patient Position: Sitting, Cuff Size: Normal)   Pulse 92   Temp 98 F (36.7 C) (Oral)   Ht 5\' 11"  (1.803 m)   Wt 210 lb 4 oz (95.4 kg)   SpO2 99%   BMI 29.32 kg/m   BP Readings from Last 3 Encounters:  01/06/20 136/88  07/05/19 130/70  01/04/19 126/72    Wt Readings from Last 3 Encounters:  01/06/20 210 lb 4 oz (95.4 kg)  07/05/19 211 lb (95.7 kg)  01/04/19 212 lb (96.2 kg)    Physical Exam Constitutional:      General: He is not in acute distress.    Appearance: He is well-developed.     Comments: NAD  Eyes:     Conjunctiva/sclera: Conjunctivae normal.     Pupils: Pupils are equal, round, and reactive to light.  Neck:     Thyroid: No thyromegaly.     Vascular: No JVD.  Cardiovascular:     Rate and Rhythm: Normal rate and regular rhythm.     Heart sounds: Normal heart sounds. No murmur. No friction rub. No gallop.   Pulmonary:     Effort: Pulmonary effort is normal. No respiratory distress.     Breath sounds: Normal breath sounds. No wheezing or rales.  Chest:     Chest wall: No tenderness.  Abdominal:     General: Bowel sounds are normal. There is no distension.     Palpations: Abdomen is soft. There  is no mass.     Tenderness: There is no abdominal tenderness. There is no guarding or rebound.  Musculoskeletal:        General: No tenderness. Normal range of motion.     Cervical back: Normal range of motion.  Lymphadenopathy:     Cervical: No cervical adenopathy.  Skin:    General: Skin is warm and dry.     Findings: No rash.  Neurological:     Mental Status: He is alert and oriented to person, place, and time.     Cranial Nerves: No cranial nerve deficit.     Motor: No abnormal muscle tone.     Coordination: Coordination normal.     Gait: Gait normal.     Deep Tendon Reflexes: Reflexes are normal and symmetric.  Psychiatric:        Behavior: Behavior  normal.        Thought Content: Thought content normal.        Judgment: Judgment normal.   Rectal - per Dr Diona Fanti Eczema patch R arm, dry skin  Lab Results  Component Value Date   WBC 8.1 07/05/2019   HGB 15.5 07/05/2019   HCT 46.1 07/05/2019   PLT 190.0 07/05/2019   GLUCOSE 133 (H) 07/05/2019   CHOL 199 01/04/2019   TRIG 85.0 01/04/2019   HDL 52.50 01/04/2019   LDLDIRECT 126.5 06/14/2013   LDLCALC 129 (H) 01/04/2019   ALT 27 01/04/2019   AST 17 01/04/2019   NA 140 07/05/2019   K 3.7 07/05/2019   CL 104 07/05/2019   CREATININE 0.93 07/05/2019   BUN 13 07/05/2019   CO2 24 07/05/2019   TSH 1.29 01/04/2019   PSA 1.24 07/01/2016   HGBA1C 7.7 (H) 07/05/2019   MICROALBUR 3.2 (H) 01/04/2019    DG Lumbar Spine Complete  Result Date: 10/16/2017 CLINICAL DATA:  Chronic back and bilateral leg pain, no injury EXAM: LUMBAR SPINE - COMPLETE 4+ VIEW COMPARISON:  None. FINDINGS: The lumbar vertebrae are in normal alignment. Intervertebral disc spaces appear relatively normal. No compression deformity is seen. The SI joints appear corticated. Calcifications in the mid lower pelvis most likely are prostatic in origin. Surgical clips are noted in the right upper quadrant from prior cholecystectomy. IMPRESSION: Normal alignment with normal intervertebral disc spaces. No acute abnormality. Electronically Signed   By: Ivar Drape M.D.   On: 10/16/2017 16:32    Assessment & Plan:   There are no diagnoses linked to this encounter.   No orders of the defined types were placed in this encounter.    Follow-up: No follow-ups on file.  Walker Kehr, MD

## 2020-01-06 NOTE — Assessment & Plan Note (Signed)
Triamc 0.1% oint tid Shower less

## 2020-01-07 LAB — URINALYSIS, ROUTINE W REFLEX MICROSCOPIC
Bilirubin Urine: NEGATIVE
Hgb urine dipstick: NEGATIVE
Ketones, ur: NEGATIVE
Nitrite: NEGATIVE
RBC / HPF: NONE SEEN (ref 0–?)
Specific Gravity, Urine: >=1.03 — AB (ref 1.000–1.030)
Urine Glucose: >=1000 — AB
Urobilinogen, UA: 0.2 (ref 0.0–1.0)
pH: 5.5 (ref 5.0–8.0)

## 2020-01-07 LAB — MICROALBUMIN / CREATININE URINE RATIO
Creatinine,U: 199.3 mg/dL
Microalb Creat Ratio: 3.7 mg/g (ref 0.0–30.0)
Microalb, Ur: 7.4 mg/dL — ABNORMAL HIGH (ref 0.0–1.9)

## 2020-01-07 LAB — LDL CHOLESTEROL, DIRECT: Direct LDL: 116 mg/dL

## 2020-01-09 DIAGNOSIS — E785 Hyperlipidemia, unspecified: Secondary | ICD-10-CM | POA: Insufficient documentation

## 2020-01-09 NOTE — Assessment & Plan Note (Addendum)
Mainly elevated triglycerides, mild.  Coronary calcium CT scoring test was offered prior and offered now.  Previously the patient declined statins.

## 2020-06-09 DIAGNOSIS — J029 Acute pharyngitis, unspecified: Secondary | ICD-10-CM | POA: Diagnosis not present

## 2020-06-10 ENCOUNTER — Telehealth: Payer: Federal, State, Local not specified - PPO | Admitting: Family Medicine

## 2020-06-11 NOTE — Progress Notes (Signed)
Visit was canceled.  No charge.

## 2020-06-15 DIAGNOSIS — N400 Enlarged prostate without lower urinary tract symptoms: Secondary | ICD-10-CM | POA: Diagnosis not present

## 2020-06-23 ENCOUNTER — Other Ambulatory Visit: Payer: Self-pay | Admitting: Internal Medicine

## 2020-07-05 ENCOUNTER — Other Ambulatory Visit: Payer: Self-pay

## 2020-07-05 ENCOUNTER — Ambulatory Visit: Payer: Federal, State, Local not specified - PPO | Admitting: Internal Medicine

## 2020-07-05 ENCOUNTER — Encounter: Payer: Self-pay | Admitting: Internal Medicine

## 2020-07-05 DIAGNOSIS — N390 Urinary tract infection, site not specified: Secondary | ICD-10-CM | POA: Diagnosis not present

## 2020-07-05 DIAGNOSIS — E119 Type 2 diabetes mellitus without complications: Secondary | ICD-10-CM | POA: Diagnosis not present

## 2020-07-05 DIAGNOSIS — E785 Hyperlipidemia, unspecified: Secondary | ICD-10-CM

## 2020-07-05 NOTE — Assessment & Plan Note (Addendum)
Coronary calcium CT scoring test was offered prior in 2020, 2021.  Previously the patient declined statins.

## 2020-07-05 NOTE — Progress Notes (Signed)
Subjective:  Patient ID: [name], male    DOB: [birth year]  Age: 69 y.o. MRN: [id number]  CC: No chief complaint on file.   HPI [name] presents for DM  Outpatient Medications Prior to Visit  Medication Sig Dispense Refill  . aspirin 81 MG EC tablet Take 81 mg by mouth daily.      . Cholecalciferol (VITAMIN D) 2000 units CAPS Take 2,000 Units by mouth daily.     . cyclobenzaprine (FLEXERIL) 5 MG tablet Take 1-2 tablets (5-10 mg total) by mouth 3 times/day as needed-between meals & bedtime for muscle spasms. 60 tablet 3  . metFORMIN (GLUCOPHAGE) 1000 MG tablet TAKE 1 TABLET(1000 MG) BY MOUTH TWICE DAILY WITH A MEAL 180 tablet 1  . Omega-3 Fatty Acids (FISH OIL) 1000 MG CAPS Take 1 capsule by mouth 2 (two) times daily.      . repaglinide (PRANDIN) 2 MG tablet TAKE 1 TABLET(2 MG) BY MOUTH THREE TIMES DAILY BEFORE MEALS 270 tablet 1  . triamcinolone ointment (KENALOG) 0.1 % Apply 1 application topically 3 (three) times daily. 80 g 2   No facility-administered medications prior to visit.    ROS: Review of Systems  Constitutional: Negative for appetite change, fatigue and unexpected weight change.  HENT: Negative for congestion, nosebleeds, sneezing, sore throat and trouble swallowing.   Eyes: Negative for itching and visual disturbance.  Respiratory: Negative for cough.   Cardiovascular: Negative for chest pain, palpitations and leg swelling.  Gastrointestinal: Negative for abdominal distention, blood in stool, diarrhea and nausea.  Genitourinary: Negative for frequency and hematuria.  Musculoskeletal: Negative for back pain, gait problem, joint swelling and neck pain.  Skin: Negative for rash.  Neurological: Negative for dizziness, tremors, speech difficulty and weakness.  Psychiatric/Behavioral: Negative for agitation, dysphoric mood, sleep disturbance and suicidal ideas. The patient is not nervous/anxious.     Objective:  BP 136/80 (BP Location: Left Arm, Patient  Position: Sitting, Cuff Size: Large)   Pulse 89   Temp 98.4 F (36.9 C) (Oral)   Ht 5\' 11"  (1.803 m)   Wt 205 lb (93 kg)   SpO2 98%   BMI 28.59 kg/m   BP Readings from Last 3 Encounters:  07/05/20 136/80  01/06/20 136/88  07/05/19 130/70    Wt Readings from Last 3 Encounters:  07/05/20 205 lb (93 kg)  01/06/20 210 lb 4 oz (95.4 kg)  07/05/19 211 lb (95.7 kg)    Physical Exam Constitutional:      General: He is not in acute distress.    Appearance: He is well-developed. He is obese.     Comments: NAD  Eyes:     Conjunctiva/sclera: Conjunctivae normal.     Pupils: Pupils are equal, round, and reactive to light.  Neck:     Thyroid: No thyromegaly.     Vascular: No JVD.  Cardiovascular:     Rate and Rhythm: Normal rate and regular rhythm.     Heart sounds: Normal heart sounds. No murmur heard.  No friction rub. No gallop.   Pulmonary:     Effort: Pulmonary effort is normal. No respiratory distress.     Breath sounds: Normal breath sounds. No wheezing or rales.  Chest:     Chest wall: No tenderness.  Abdominal:     General: Bowel sounds are normal. There is no distension.     Palpations: Abdomen is soft. There is no mass.     Tenderness: There is no abdominal tenderness. There is no guarding  or rebound.  Musculoskeletal:        General: No tenderness. Normal range of motion.     Cervical back: Normal range of motion.  Lymphadenopathy:     Cervical: No cervical adenopathy.  Skin:    General: Skin is warm and dry.     Findings: No rash.  Neurological:     Mental Status: He is alert and oriented to person, place, and time.     Cranial Nerves: No cranial nerve deficit.     Motor: No abnormal muscle tone.     Coordination: Coordination normal.     Gait: Gait normal.     Deep Tendon Reflexes: Reflexes are normal and symmetric.  Psychiatric:        Behavior: Behavior normal.        Thought Content: Thought content normal.        Judgment: Judgment normal.      Lab Results  Component Value Date   WBC 7.9 01/06/2020   HGB 16.0 01/06/2020   HCT 47.7 01/06/2020   PLT 179.0 01/06/2020   GLUCOSE 127 (H) 01/06/2020   CHOL 191 01/06/2020   TRIG 237.0 (H) 01/06/2020   HDL 51.60 01/06/2020   LDLDIRECT 116.0 01/06/2020   LDLCALC 129 (H) 01/04/2019   ALT 28 01/06/2020   AST 22 01/06/2020   NA 138 01/06/2020   K 4.0 01/06/2020   CL 103 01/06/2020   CREATININE 0.86 01/06/2020   BUN 14 01/06/2020   CO2 25 01/06/2020   TSH 0.88 01/06/2020   PSA 1.24 07/01/2016   HGBA1C 8.3 (H) 01/06/2020   MICROALBUR 7.4 (H) 01/06/2020    DG Lumbar Spine Complete  Result Date: 10/16/2017 CLINICAL DATA:  Chronic back and bilateral leg pain, no injury EXAM: LUMBAR SPINE - COMPLETE 4+ VIEW COMPARISON:  None. FINDINGS: The lumbar vertebrae are in normal alignment. Intervertebral disc spaces appear relatively normal. No compression deformity is seen. The SI joints appear corticated. Calcifications in the mid lower pelvis most likely are prostatic in origin. Surgical clips are noted in the right upper quadrant from prior cholecystectomy. IMPRESSION: Normal alignment with normal intervertebral disc spaces. No acute abnormality. Electronically Signed   By: Dwyane Dee M.D.   On: 10/16/2017 16:32    Assessment & Plan:   Sonda Primes, MD

## 2020-07-06 LAB — BASIC METABOLIC PANEL
BUN: 11 mg/dL (ref 6–23)
CO2: 29 mEq/L (ref 19–32)
Calcium: 9.7 mg/dL (ref 8.4–10.5)
Chloride: 104 mEq/L (ref 96–112)
Creatinine, Ser: 0.89 mg/dL (ref 0.40–1.50)
GFR: 102.68 mL/min (ref >60.00)
Glucose, Bld: 99 mg/dL (ref 70–99)
Potassium: 4.1 mEq/L (ref 3.5–5.1)
Sodium: 138 mEq/L (ref 135–145)

## 2020-07-06 LAB — HEMOGLOBIN A1C: Hgb A1c MFr Bld: 8.3 % — ABNORMAL HIGH (ref 4.6–6.5)

## 2020-07-09 ENCOUNTER — Other Ambulatory Visit: Payer: Self-pay | Admitting: Internal Medicine

## 2020-07-09 MED ORDER — PIOGLITAZONE HCL 15 MG PO TABS: 15.0000 mg | ORAL_TABLET | Freq: Every day | ORAL | 3 refills | Status: DC

## 2020-07-09 NOTE — Assessment & Plan Note (Signed)
Uncontrolled.  Added Actos

## 2020-07-09 NOTE — Assessment & Plan Note (Signed)
Monitor UA

## 2020-07-31 DIAGNOSIS — N5201 Erectile dysfunction due to arterial insufficiency: Secondary | ICD-10-CM | POA: Diagnosis not present

## 2020-07-31 DIAGNOSIS — N400 Enlarged prostate without lower urinary tract symptoms: Secondary | ICD-10-CM | POA: Diagnosis not present

## 2020-12-21 ENCOUNTER — Encounter: Payer: Self-pay | Admitting: Internal Medicine

## 2020-12-21 ENCOUNTER — Other Ambulatory Visit: Payer: Self-pay

## 2020-12-21 ENCOUNTER — Ambulatory Visit: Payer: Federal, State, Local not specified - PPO | Admitting: Internal Medicine

## 2020-12-21 VITALS — BP 132/80 | HR 99 | Temp 98.2°F | Wt 215.6 lb

## 2020-12-21 DIAGNOSIS — E119 Type 2 diabetes mellitus without complications: Secondary | ICD-10-CM | POA: Diagnosis not present

## 2020-12-21 DIAGNOSIS — N390 Urinary tract infection, site not specified: Secondary | ICD-10-CM

## 2020-12-21 DIAGNOSIS — N401 Enlarged prostate with lower urinary tract symptoms: Secondary | ICD-10-CM | POA: Diagnosis not present

## 2020-12-21 DIAGNOSIS — E785 Hyperlipidemia, unspecified: Secondary | ICD-10-CM

## 2020-12-21 DIAGNOSIS — R350 Frequency of micturition: Secondary | ICD-10-CM

## 2020-12-21 LAB — COMPREHENSIVE METABOLIC PANEL
ALT: 19 U/L (ref 0–53)
AST: 16 U/L (ref 0–37)
Albumin: 4.7 g/dL (ref 3.5–5.2)
Alkaline Phosphatase: 54 U/L (ref 39–117)
BUN: 14 mg/dL (ref 6–23)
CO2: 27 mEq/L (ref 19–32)
Calcium: 10.2 mg/dL (ref 8.4–10.5)
Chloride: 103 mEq/L (ref 96–112)
Creatinine, Ser: 1.01 mg/dL (ref 0.40–1.50)
GFR: 76.14 mL/min (ref >60.00)
Glucose, Bld: 81 mg/dL (ref 70–99)
Potassium: 3.9 mEq/L (ref 3.5–5.1)
Sodium: 138 mEq/L (ref 135–145)
Total Bilirubin: 0.4 mg/dL (ref 0.2–1.2)
Total Protein: 7.7 g/dL (ref 6.0–8.3)

## 2020-12-21 LAB — HEMOGLOBIN A1C: Hgb A1c MFr Bld: 7.6 % — ABNORMAL HIGH (ref 4.6–6.5)

## 2020-12-21 NOTE — Assessment & Plan Note (Signed)
UA

## 2020-12-21 NOTE — Patient Instructions (Signed)

## 2020-12-21 NOTE — Progress Notes (Signed)
   Subjective:  Patient ID: [name], male    DOB: 1951/04/10  Age: 70 y.o. MRN: [id number]  CC: Follow-up (6 month f/u)   HPI [name] presents for DM, dyslipidemia, chronic UTI  Outpatient Medications Prior to Visit  Medication Sig Dispense Refill  . aspirin 81 MG EC tablet Take 81 mg by mouth daily.    . Cholecalciferol (VITAMIN D) 2000 units CAPS Take 2,000 Units by mouth daily.    . cyclobenzaprine (FLEXERIL) 5 MG tablet Take 1-2 tablets (5-10 mg total) by mouth 3 times/day as needed-between meals & bedtime for muscle spasms. 60 tablet 3  . metFORMIN (GLUCOPHAGE) 1000 MG tablet TAKE 1 TABLET(1000 MG) BY MOUTH TWICE DAILY WITH A MEAL 180 tablet 1  . Omega-3 Fatty Acids (FISH OIL) 1000 MG CAPS Take 1 capsule by mouth 2 (two) times daily.    . pioglitazone (ACTOS) 15 MG tablet Take 1 tablet (15 mg total) by mouth daily. 90 tablet 3  . repaglinide (PRANDIN) 2 MG tablet TAKE 1 TABLET(2 MG) BY MOUTH THREE TIMES DAILY BEFORE MEALS 270 tablet 1  . triamcinolone ointment (KENALOG) 0.1 % Apply 1 application topically 3 (three) times daily. 80 g 2   No facility-administered medications prior to visit.    ROS: Review of Systems  Objective:  BP 132/80 (BP Location: Left Arm)   Pulse 99   Temp 98.2 F (36.8 C) (Oral)   Wt 215 lb 9.6 oz (97.8 kg)   SpO2 95%   BMI 30.07 kg/m   BP Readings from Last 3 Encounters:  12/21/20 132/80  07/05/20 136/80  01/06/20 136/88    Wt Readings from Last 3 Encounters:  12/21/20 215 lb 9.6 oz (97.8 kg)  07/05/20 205 lb (93 kg)  01/06/20 210 lb 4 oz (95.4 kg)    Physical Exam  Lab Results  Component Value Date   WBC 7.9 01/06/2020   HGB 16.0 01/06/2020   HCT 47.7 01/06/2020   PLT 179.0 01/06/2020   GLUCOSE 99 07/05/2020   CHOL 191 01/06/2020   TRIG 237.0 (H) 01/06/2020   HDL 51.60 01/06/2020   LDLDIRECT 116.0 01/06/2020   LDLCALC 129 (H) 01/04/2019   ALT 28 01/06/2020   AST 22 01/06/2020   NA 138 07/05/2020   K 4.1  07/05/2020   CL 104 07/05/2020   CREATININE 0.89 07/05/2020   BUN 11 07/05/2020   CO2 29 07/05/2020   TSH 0.88 01/06/2020   PSA 1.24 07/01/2016   HGBA1C 8.3 (H) 07/05/2020   MICROALBUR 7.4 (H) 01/06/2020    DG Lumbar Spine Complete  Result Date: 10/16/2017 CLINICAL DATA:  Chronic back and bilateral leg pain, no injury EXAM: LUMBAR SPINE - COMPLETE 4+ VIEW COMPARISON:  None. FINDINGS: The lumbar vertebrae are in normal alignment. Intervertebral disc spaces appear relatively normal. No compression deformity is seen. The SI joints appear corticated. Calcifications in the mid lower pelvis most likely are prostatic in origin. Surgical clips are noted in the right upper quadrant from prior cholecystectomy. IMPRESSION: Normal alignment with normal intervertebral disc spaces. No acute abnormality. Electronically Signed   By: Dwyane Dee M.D.   On: 10/16/2017 16:32    Assessment & Plan:    Sonda Primes, MD

## 2020-12-21 NOTE — Assessment & Plan Note (Signed)
On Omega-3  cardiac CT scan for calcium scoring was offered

## 2020-12-21 NOTE — Assessment & Plan Note (Signed)
F/u w/Dr Dahlstedt - PSA q 12 mo

## 2020-12-21 NOTE — Assessment & Plan Note (Signed)
Cont w/Metformin, Prandin Actos 15 mg daily added   cardiac CT scan for calcium scoring was offered

## 2020-12-21 NOTE — Addendum Note (Signed)
Addended by: Ebony Cargo on: 12/21/2020 02:37 PM   Modules accepted: Orders

## 2021-01-04 ENCOUNTER — Telehealth: Payer: Self-pay | Admitting: Internal Medicine

## 2021-01-04 DIAGNOSIS — E785 Hyperlipidemia, unspecified: Secondary | ICD-10-CM

## 2021-01-04 NOTE — Telephone Encounter (Signed)
Patient called and said that he does want to do the Calcium  CT Scoring test. Please call the patient back at [phone].

## 2021-01-05 NOTE — Telephone Encounter (Signed)
Called pt no answer LMOM w/MD response../lmb 

## 2021-01-05 NOTE — Telephone Encounter (Signed)
Okay.  I will put an order in. Elye needs to call the number I gave him to schedule the time.  Thanks

## 2021-01-15 ENCOUNTER — Other Ambulatory Visit: Payer: Self-pay

## 2021-01-15 ENCOUNTER — Ambulatory Visit (INDEPENDENT_AMBULATORY_CARE_PROVIDER_SITE_OTHER)
Admission: RE | Admit: 2021-01-15 | Discharge: 2021-01-15 | Disposition: A | Discharge status: Home / Self Care | Payer: Self-pay | Source: Ambulatory Visit | Attending: Internal Medicine | Admitting: Internal Medicine

## 2021-01-15 DIAGNOSIS — E785 Hyperlipidemia, unspecified: Secondary | ICD-10-CM

## 2021-01-16 ENCOUNTER — Telehealth: Payer: Self-pay

## 2021-01-16 ENCOUNTER — Encounter: Payer: Self-pay | Admitting: Internal Medicine

## 2021-01-16 DIAGNOSIS — I2510 Atherosclerotic heart disease of native coronary artery without angina pectoris: Secondary | ICD-10-CM | POA: Insufficient documentation

## 2021-01-16 NOTE — Telephone Encounter (Signed)
I do not see any addendum.  Thanks

## 2021-03-15 ENCOUNTER — Other Ambulatory Visit: Payer: Self-pay | Admitting: Internal Medicine

## 2021-03-23 ENCOUNTER — Other Ambulatory Visit: Payer: Self-pay | Admitting: Internal Medicine

## 2021-06-13 ENCOUNTER — Other Ambulatory Visit: Payer: Self-pay | Admitting: Internal Medicine

## 2021-06-21 ENCOUNTER — Other Ambulatory Visit: Payer: Self-pay

## 2021-06-21 ENCOUNTER — Encounter: Payer: Self-pay | Admitting: Internal Medicine

## 2021-06-21 ENCOUNTER — Ambulatory Visit: Payer: Federal, State, Local not specified - PPO | Admitting: Internal Medicine

## 2021-06-21 VITALS — BP 134/80 | HR 88 | Temp 98.2°F | Ht 71.0 in | Wt 211.6 lb

## 2021-06-21 DIAGNOSIS — I2583 Coronary atherosclerosis due to lipid rich plaque: Secondary | ICD-10-CM

## 2021-06-21 DIAGNOSIS — E119 Type 2 diabetes mellitus without complications: Secondary | ICD-10-CM

## 2021-06-21 DIAGNOSIS — Z23 Encounter for immunization: Secondary | ICD-10-CM | POA: Diagnosis not present

## 2021-06-21 DIAGNOSIS — I2510 Atherosclerotic heart disease of native coronary artery without angina pectoris: Secondary | ICD-10-CM

## 2021-06-21 DIAGNOSIS — E785 Hyperlipidemia, unspecified: Secondary | ICD-10-CM | POA: Diagnosis not present

## 2021-06-21 DIAGNOSIS — N390 Urinary tract infection, site not specified: Secondary | ICD-10-CM | POA: Diagnosis not present

## 2021-06-21 DIAGNOSIS — Z0000 Encounter for general adult medical examination without abnormal findings: Secondary | ICD-10-CM

## 2021-06-21 DIAGNOSIS — R030 Elevated blood-pressure reading, without diagnosis of hypertension: Secondary | ICD-10-CM | POA: Diagnosis not present

## 2021-06-21 MED ORDER — PIOGLITAZONE HCL 15 MG PO TABS: 15.0000 mg | ORAL_TABLET | Freq: Every day | ORAL | 3 refills | Status: DC

## 2021-06-21 NOTE — Patient Instructions (Signed)
Due to tDAP vaccine

## 2021-06-21 NOTE — Progress Notes (Signed)
Subjective:  Patient ID: [name], male    DOB: 1951-07-28  Age: 70 y.o. MRN: [id number]  CC: Follow-up (6 month f/u)   HPI [name] presents for DM, elevated BP, UTI  Outpatient Medications Prior to Visit  Medication Sig Dispense Refill   aspirin 81 MG EC tablet Take 81 mg by mouth daily.     Cholecalciferol (VITAMIN D) 2000 units CAPS Take 2,000 Units by mouth daily.     cyclobenzaprine (FLEXERIL) 5 MG tablet Take 1-2 tablets (5-10 mg total) by mouth 3 times/day as needed-between meals & bedtime for muscle spasms. 60 tablet 3   metFORMIN (GLUCOPHAGE) 1000 MG tablet Take 1 tablet (1,000 mg total) by mouth 2 (two) times daily with a meal. 180 tablet 3   Omega-3 Fatty Acids (FISH OIL) 1000 MG CAPS Take 1 capsule by mouth 2 (two) times daily.     pioglitazone (ACTOS) 15 MG tablet Take 1 tablet (15 mg total) by mouth daily. 90 tablet 3   repaglinide (PRANDIN) 2 MG tablet TAKE 1 TABLET(2 MG) BY MOUTH THREE TIMES DAILY BEFORE MEALS 270 tablet 1   triamcinolone ointment (KENALOG) 0.1 % Apply 1 application topically 3 (three) times daily. 80 g 2   No facility-administered medications prior to visit.    ROS: Review of Systems  Constitutional:  Negative for appetite change, fatigue and unexpected weight change.  HENT:  Negative for congestion, nosebleeds, sneezing, sore throat and trouble swallowing.   Eyes:  Negative for itching and visual disturbance.  Respiratory:  Negative for cough.   Cardiovascular:  Negative for chest pain, palpitations and leg swelling.  Gastrointestinal:  Negative for abdominal distention, blood in stool, diarrhea and nausea.  Genitourinary:  Negative for frequency and hematuria.  Musculoskeletal:  Negative for back pain, gait problem, joint swelling and neck pain.  Skin:  Negative for rash.  Neurological:  Negative for dizziness, tremors, speech difficulty and weakness.  Psychiatric/Behavioral:  Negative for agitation, dysphoric mood, sleep disturbance  and suicidal ideas. The patient is not nervous/anxious.    Objective:  BP 134/80 (BP Location: Left Arm)   Pulse 88   Temp 98.2 F (36.8 C) (Oral)   Ht 5\' 11"  (1.803 m)   Wt 211 lb 9.6 oz (96 kg)   SpO2 94%   BMI 29.51 kg/m   BP Readings from Last 3 Encounters:  06/21/21 134/80  12/21/20 132/80  07/05/20 136/80    Wt Readings from Last 3 Encounters:  06/21/21 211 lb 9.6 oz (96 kg)  12/21/20 215 lb 9.6 oz (97.8 kg)  07/05/20 205 lb (93 kg)    Physical Exam Constitutional:      General: He is not in acute distress.    Appearance: Normal appearance. He is well-developed.     Comments: NAD  Eyes:     Conjunctiva/sclera: Conjunctivae normal.     Pupils: Pupils are equal, round, and reactive to light.  Neck:     Thyroid: No thyromegaly.     Vascular: No JVD.  Cardiovascular:     Rate and Rhythm: Normal rate and regular rhythm.     Heart sounds: Normal heart sounds. No murmur heard.   No friction rub. No gallop.  Pulmonary:     Effort: Pulmonary effort is normal. No respiratory distress.     Breath sounds: Normal breath sounds. No wheezing or rales.  Chest:     Chest wall: No tenderness.  Abdominal:     General: Bowel sounds are normal. There is no  distension.     Palpations: Abdomen is soft. There is no mass.     Tenderness: There is no abdominal tenderness. There is no guarding or rebound.  Musculoskeletal:        General: No tenderness. Normal range of motion.     Cervical back: Normal range of motion.  Lymphadenopathy:     Cervical: No cervical adenopathy.  Skin:    General: Skin is warm and dry.     Findings: No rash.  Neurological:     Mental Status: He is alert and oriented to person, place, and time.     Cranial Nerves: No cranial nerve deficit.     Motor: No abnormal muscle tone.     Coordination: Coordination normal.     Gait: Gait normal.     Deep Tendon Reflexes: Reflexes are normal and symmetric.  Psychiatric:        Behavior: Behavior normal.         Thought Content: Thought content normal.        Judgment: Judgment normal.    Lab Results  Component Value Date   WBC 7.9 01/06/2020   HGB 16.0 01/06/2020   HCT 47.7 01/06/2020   PLT 179.0 01/06/2020   GLUCOSE 81 12/21/2020   CHOL 191 01/06/2020   TRIG 237.0 (H) 01/06/2020   HDL 51.60 01/06/2020   LDLDIRECT 116.0 01/06/2020   LDLCALC 129 (H) 01/04/2019   ALT 19 12/21/2020   AST 16 12/21/2020   NA 138 12/21/2020   K 3.9 12/21/2020   CL 103 12/21/2020   CREATININE 1.01 12/21/2020   BUN 14 12/21/2020   CO2 27 12/21/2020   TSH 0.88 01/06/2020   PSA 1.24 07/01/2016   HGBA1C 7.6 (H) 12/21/2020   MICROALBUR 7.4 (H) 01/06/2020    CT CARDIAC SCORING (SELF PAY ONLY)  Addendum Date: 01/15/2021   ADDENDUM REPORT: 01/15/2021 20:39 CLINICAL DATA:  Risk stratification EXAM: Coronary Calcium Score TECHNIQUE: The patient was scanned on a [employer] scanner. Axial non-contrast 3 mm slices were carried out through the heart. The data set was analyzed on a dedicated work station and scored using the Agatson method. FINDINGS: Non-cardiac: See separate report from [facility]. Ascending Aorta: Borderline dilated at 4.0 cm (non-contrast), however, there is motion artifact. Pericardium: Normal Coronary arteries: Normal origins IMPRESSION: Coronary calcium score of 2.75. This was 36th percentile for age and sex matched control. Borderline dilated aortic root at 4.0 cm (motion artifact is noted, non-contrast) This is a low risk calcium score given age. Electronically Signed   By: Chrystie Nose M.D.   On: 01/15/2021 20:39   Result Date: 01/15/2021 EXAM: OVER-READ INTERPRETATION  CT CHEST The following report is an over-read performed by radiologist Dr. Donzetta Kohut of [facility] on 01/15/2021. This over-read does not include interpretation of cardiac or coronary anatomy or pathology. The coronary calcium score interpretation by the cardiologist is attached. COMPARISON:  None  FINDINGS: Vascular: Please see dedicated coronary calcium scoring for additional detail regarding cardiac structures. There is approximately 4 cm caliber of the ascending thoracic aorta as measured in the coronal plane on image 42 series 5. There is no visible calcified atherosclerosis of the thoracic aorta. The central pulmonary vasculature 3.2 cm. Mediastinum/Nodes: Patulous esophagus. No adenopathy in the chest as visualized, from the mid chest through the lung bases. Lungs/Pleura: Basilar atelectasis. Mild bronchial narrowing of mainstem bronchi bilaterally and signs of mosaic attenuation. No effusion. No consolidative changes. Upper Abdomen: Incidental imaging of upper abdominal  contents with signs of hepatic steatosis and nodular hepatic contour. Musculoskeletal: No acute musculoskeletal process. Spinal degenerative changes. Limited imaging of the chest. 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 Recommend annual imaging followup by CTA or MRA. This recommendation follows 2010 ACCF/AHA/AATS/ACR/ASA/SCA/SCAI/SIR/STS/SVM Guidelines for the Diagnosis and Management of Patients with Thoracic Aortic Disease. Circulation. 2010; 121: Q492-E100. Aortic aneurysm NOS (ICD10-I71.9) Mild mosaic attenuation with low lung volumes, of uncertain significance associated with mild airway narrowing, correlate with any respiratory symptoms with high-resolution chest CT on follow-up as warranted. Constellation of findings with mosaic attenuation and airway narrowing could be seen in the setting of but is not specific for bronchomalacia. Electronically Signed: By: Donzetta Kohut M.D. On: 01/15/2021 17:11    Assessment & Plan:   There are no diagnoses linked to this encounter.   No orders of the defined types were placed in this encounter.    Follow-up: No follow-ups on file.  Sonda Primes, MD

## 2021-06-21 NOTE — Assessment & Plan Note (Signed)
BP Readings from Last 3 Encounters:  06/21/21 134/80  12/21/20 132/80  07/05/20 136/80

## 2021-06-21 NOTE — Addendum Note (Signed)
Addended by: Deatra James on: 06/21/2021 04:15 PM   Modules accepted: Orders

## 2021-06-21 NOTE — Addendum Note (Signed)
Addended by: Deatra James on: 06/21/2021 04:21 PM   Modules accepted: Orders

## 2021-06-21 NOTE — Assessment & Plan Note (Signed)
Cont on Omega-3

## 2021-06-21 NOTE — Assessment & Plan Note (Signed)
F/u w/Dr Dahlstedt 

## 2021-06-21 NOTE — Assessment & Plan Note (Signed)
On aspirin and fish oil

## 2021-06-22 ENCOUNTER — Other Ambulatory Visit (INDEPENDENT_AMBULATORY_CARE_PROVIDER_SITE_OTHER): Payer: Federal, State, Local not specified - PPO

## 2021-06-22 DIAGNOSIS — Z0000 Encounter for general adult medical examination without abnormal findings: Secondary | ICD-10-CM | POA: Diagnosis not present

## 2021-06-22 DIAGNOSIS — Z125 Encounter for screening for malignant neoplasm of prostate: Secondary | ICD-10-CM

## 2021-06-22 DIAGNOSIS — E119 Type 2 diabetes mellitus without complications: Secondary | ICD-10-CM | POA: Diagnosis not present

## 2021-06-22 LAB — URINALYSIS, ROUTINE W REFLEX MICROSCOPIC
Bilirubin Urine: NEGATIVE
Ketones, ur: NEGATIVE
Nitrite: NEGATIVE
Specific Gravity, Urine: 1.025 (ref 1.000–1.030)
Total Protein, Urine: NEGATIVE
Urine Glucose: NEGATIVE
Urobilinogen, UA: 0.2 (ref 0.0–1.0)
pH: 6 (ref 5.0–8.0)

## 2021-06-22 LAB — CBC WITH DIFFERENTIAL/PLATELET
Basophils Absolute: 0 10*3/uL (ref 0.0–0.1)
Basophils Relative: 0.4 % (ref 0.0–3.0)
Eosinophils Absolute: 0.3 10*3/uL (ref 0.0–0.7)
Eosinophils Relative: 3.1 % (ref 0.0–5.0)
HCT: 45.5 % (ref 39.0–52.0)
Hemoglobin: 15.5 g/dL (ref 13.0–17.0)
Lymphocytes Relative: 25.5 % (ref 12.0–46.0)
Lymphs Abs: 2.3 10*3/uL (ref 0.7–4.0)
MCHC: 34 g/dL (ref 30.0–36.0)
MCV: 94.3 fl (ref 78.0–100.0)
Monocytes Absolute: 0.8 10*3/uL (ref 0.1–1.0)
Monocytes Relative: 8.9 % (ref 3.0–12.0)
Neutro Abs: 5.6 10*3/uL (ref 1.4–7.7)
Neutrophils Relative %: 62.1 % (ref 43.0–77.0)
Platelets: 209 10*3/uL (ref 150.0–400.0)
RBC: 4.83 Mil/uL (ref 4.22–5.81)
RDW: 14 % (ref 11.5–15.5)
WBC: 9 10*3/uL (ref 4.0–10.5)

## 2021-06-22 LAB — LIPID PANEL
Cholesterol: 191 mg/dL (ref 0–200)
HDL: 54.8 mg/dL (ref >39.00)
LDL Cholesterol: 122 mg/dL — ABNORMAL HIGH (ref 0–99)
NonHDL: 136.4
Total CHOL/HDL Ratio: 3
Triglycerides: 71 mg/dL (ref 0.0–149.0)
VLDL: 14.2 mg/dL (ref 0.0–40.0)

## 2021-06-22 LAB — COMPREHENSIVE METABOLIC PANEL
ALT: 18 U/L (ref 0–53)
AST: 13 U/L (ref 0–37)
Albumin: 4.5 g/dL (ref 3.5–5.2)
Alkaline Phosphatase: 45 U/L (ref 39–117)
BUN: 15 mg/dL (ref 6–23)
CO2: 26 mEq/L (ref 19–32)
Calcium: 9.6 mg/dL (ref 8.4–10.5)
Chloride: 101 mEq/L (ref 96–112)
Creatinine, Ser: 0.96 mg/dL (ref 0.40–1.50)
GFR: 80.64 mL/min (ref >60.00)
Glucose, Bld: 123 mg/dL — ABNORMAL HIGH (ref 70–99)
Potassium: 4.3 mEq/L (ref 3.5–5.1)
Sodium: 137 mEq/L (ref 135–145)
Total Bilirubin: 1 mg/dL (ref 0.2–1.2)
Total Protein: 7.3 g/dL (ref 6.0–8.3)

## 2021-06-22 LAB — TSH: TSH: 0.75 u[IU]/mL (ref 0.35–5.50)

## 2021-06-22 LAB — PSA: PSA: 0.82 ng/mL (ref 0.10–4.00)

## 2021-06-22 LAB — HEMOGLOBIN A1C: Hgb A1c MFr Bld: 7.5 % — ABNORMAL HIGH (ref 4.6–6.5)

## 2021-06-26 ENCOUNTER — Other Ambulatory Visit: Payer: Self-pay | Admitting: Internal Medicine

## 2021-08-06 DIAGNOSIS — N400 Enlarged prostate without lower urinary tract symptoms: Secondary | ICD-10-CM | POA: Diagnosis not present

## 2021-08-06 DIAGNOSIS — N5201 Erectile dysfunction due to arterial insufficiency: Secondary | ICD-10-CM | POA: Diagnosis not present

## 2021-08-06 DIAGNOSIS — R8271 Bacteriuria: Secondary | ICD-10-CM | POA: Diagnosis not present

## 2021-09-04 DIAGNOSIS — J029 Acute pharyngitis, unspecified: Secondary | ICD-10-CM | POA: Diagnosis not present

## 2021-09-04 DIAGNOSIS — U071 COVID-19: Secondary | ICD-10-CM | POA: Diagnosis not present

## 2021-09-11 ENCOUNTER — Other Ambulatory Visit: Payer: Self-pay | Admitting: Internal Medicine

## 2021-12-24 ENCOUNTER — Other Ambulatory Visit: Payer: Self-pay

## 2021-12-24 ENCOUNTER — Ambulatory Visit: Payer: Federal, State, Local not specified - PPO | Admitting: Internal Medicine

## 2021-12-24 ENCOUNTER — Encounter: Payer: Self-pay | Admitting: Internal Medicine

## 2021-12-24 DIAGNOSIS — I2510 Atherosclerotic heart disease of native coronary artery without angina pectoris: Secondary | ICD-10-CM

## 2021-12-24 DIAGNOSIS — Z23 Encounter for immunization: Secondary | ICD-10-CM

## 2021-12-24 DIAGNOSIS — N390 Urinary tract infection, site not specified: Secondary | ICD-10-CM

## 2021-12-24 DIAGNOSIS — E119 Type 2 diabetes mellitus without complications: Secondary | ICD-10-CM | POA: Diagnosis not present

## 2021-12-24 DIAGNOSIS — I2583 Coronary atherosclerosis due to lipid rich plaque: Secondary | ICD-10-CM

## 2021-12-24 NOTE — Assessment & Plan Note (Signed)
Check UA 

## 2021-12-24 NOTE — Progress Notes (Addendum)
Subjective:  Patient ID: Jayla Nitcher, male    DOB: [birth year]  Age: 71 y.o. MRN: [id number]  CC: Follow-up (6 months f/u)   HPI Alvonte Robinson presents for DM, UTI, CAD  Outpatient Medications Prior to Visit  Medication Sig Dispense Refill   aspirin 81 MG EC tablet Take 81 mg by mouth daily.     Cholecalciferol (VITAMIN D) 2000 units CAPS Take 2,000 Units by mouth daily.     cyclobenzaprine (FLEXERIL) 5 MG tablet Take 1-2 tablets (5-10 mg total) by mouth 3 times/day as needed-between meals & bedtime for muscle spasms. 60 tablet 3   metFORMIN (GLUCOPHAGE) 1000 MG tablet Take 1 tablet (1,000 mg total) by mouth 2 (two) times daily with a meal. 180 tablet 3   Omega-3 Fatty Acids (FISH OIL) 1000 MG CAPS Take 1 capsule by mouth 2 (two) times daily.     pioglitazone (ACTOS) 15 MG tablet TAKE 1 TABLET(15 MG) BY MOUTH DAILY 90 tablet 3   repaglinide (PRANDIN) 2 MG tablet TAKE 1 TABLET(2 MG) BY MOUTH THREE TIMES DAILY BEFORE MEALS 270 tablet 2   triamcinolone ointment (KENALOG) 0.1 % Apply 1 application topically 3 (three) times daily. (Patient not taking: Reported on 12/24/2021) 80 g 2   No facility-administered medications prior to visit.    ROS: Review of Systems  Constitutional:  Negative for appetite change, fatigue and unexpected weight change.  HENT:  Negative for congestion, nosebleeds, sneezing, sore throat and trouble swallowing.   Eyes:  Negative for itching and visual disturbance.  Respiratory:  Negative for cough.   Cardiovascular:  Negative for chest pain, palpitations and leg swelling.  Gastrointestinal:  Negative for abdominal distention, blood in stool, diarrhea and nausea.  Genitourinary:  Negative for frequency and hematuria.  Musculoskeletal:  Negative for back pain, gait problem, joint swelling and neck pain.  Skin:  Negative for rash.  Neurological:  Negative for dizziness, tremors, speech difficulty and weakness.  Psychiatric/Behavioral:  Negative for agitation,  dysphoric mood and sleep disturbance. The patient is not nervous/anxious.    Objective:  BP 130/82 (BP Location: Left Arm)    Pulse 73    Temp 97.9 F (36.6 C) (Oral)    Ht 5\' 11"  (1.803 m)    Wt 212 lb 9.6 oz (96.4 kg)    SpO2 95%    BMI 29.65 kg/m   BP Readings from Last 3 Encounters:  12/24/21 130/82  06/21/21 134/80  12/21/20 132/80    Wt Readings from Last 3 Encounters:  12/24/21 212 lb 9.6 oz (96.4 kg)  06/21/21 211 lb 9.6 oz (96 kg)  12/21/20 215 lb 9.6 oz (97.8 kg)    Physical Exam Constitutional:      General: He is not in acute distress.    Appearance: He is well-developed.     Comments: NAD  Eyes:     Conjunctiva/sclera: Conjunctivae normal.     Pupils: Pupils are equal, round, and reactive to light.  Neck:     Thyroid: No thyromegaly.     Vascular: No JVD.  Cardiovascular:     Rate and Rhythm: Normal rate and regular rhythm.     Heart sounds: Normal heart sounds. No murmur heard.   No friction rub. No gallop.  Pulmonary:     Effort: Pulmonary effort is normal. No respiratory distress.     Breath sounds: Normal breath sounds. No wheezing or rales.  Chest:     Chest wall: No tenderness.  Abdominal:     General:  Bowel sounds are normal. There is no distension.     Palpations: Abdomen is soft. There is no mass.     Tenderness: There is no abdominal tenderness. There is no guarding or rebound.  Musculoskeletal:        General: No tenderness. Normal range of motion.     Cervical back: Normal range of motion.  Lymphadenopathy:     Cervical: No cervical adenopathy.  Skin:    General: Skin is warm and dry.     Findings: No rash.  Neurological:     Mental Status: He is alert and oriented to person, place, and time.     Cranial Nerves: No cranial nerve deficit.     Motor: No abnormal muscle tone.     Coordination: Coordination normal.     Gait: Gait normal.     Deep Tendon Reflexes: Reflexes are normal and symmetric.  Psychiatric:        Behavior: Behavior  normal.        Thought Content: Thought content normal.        Judgment: Judgment normal.    Lab Results  Component Value Date   WBC 9.0 06/22/2021   HGB 15.5 06/22/2021   HCT 45.5 06/22/2021   PLT 209.0 06/22/2021   GLUCOSE 123 (H) 06/22/2021   CHOL 191 06/22/2021   TRIG 71.0 06/22/2021   HDL 54.80 06/22/2021   LDLDIRECT 116.0 01/06/2020   LDLCALC 122 (H) 06/22/2021   ALT 18 06/22/2021   AST 13 06/22/2021   NA 137 06/22/2021   K 4.3 06/22/2021   CL 101 06/22/2021   CREATININE 0.96 06/22/2021   BUN 15 06/22/2021   CO2 26 06/22/2021   TSH 0.75 06/22/2021   PSA 0.82 06/22/2021   HGBA1C 7.5 (H) 06/22/2021   MICROALBUR 7.4 (H) 01/06/2020    CT CARDIAC SCORING (SELF PAY ONLY)  Addendum Date: 01/15/2021   ADDENDUM REPORT: 01/15/2021 20:39 CLINICAL DATA:  Risk stratification EXAM: Coronary Calcium Score TECHNIQUE: The patient was scanned on a [employer] scanner. Axial non-contrast 3 mm slices were carried out through the heart. The data set was analyzed on a dedicated work station and scored using the Agatson method. FINDINGS: Non-cardiac: See separate report from [facility]. Ascending Aorta: Borderline dilated at 4.0 cm (non-contrast), however, there is motion artifact. Pericardium: Normal Coronary arteries: Normal origins IMPRESSION: Coronary calcium score of 2.75. This was 36th percentile for age and sex matched control. Borderline dilated aortic root at 4.0 cm (motion artifact is noted, non-contrast) This is a low risk calcium score given age. Electronically Signed   By: Chrystie Nose M.D.   On: 01/15/2021 20:39   Result Date: 01/15/2021 EXAM: OVER-READ INTERPRETATION  CT CHEST The following report is an over-read performed by radiologist Dr. Donzetta Kohut of [facility] on 01/15/2021. This over-read does not include interpretation of cardiac or coronary anatomy or pathology. The coronary calcium score interpretation by the cardiologist is attached.  COMPARISON:  None FINDINGS: Vascular: Please see dedicated coronary calcium scoring for additional detail regarding cardiac structures. There is approximately 4 cm caliber of the ascending thoracic aorta as measured in the coronal plane on image 42 series 5. There is no visible calcified atherosclerosis of the thoracic aorta. The central pulmonary vasculature 3.2 cm. Mediastinum/Nodes: Patulous esophagus. No adenopathy in the chest as visualized, from the mid chest through the lung bases. Lungs/Pleura: Basilar atelectasis. Mild bronchial narrowing of mainstem bronchi bilaterally and signs of mosaic attenuation. No effusion. No consolidative changes.  Upper Abdomen: Incidental imaging of upper abdominal contents with signs of hepatic steatosis and nodular hepatic contour. Musculoskeletal: No acute musculoskeletal process. Spinal degenerative changes. Limited imaging of the chest. 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 Recommend annual imaging followup by CTA or MRA. This recommendation follows 2010 ACCF/AHA/AATS/ACR/ASA/SCA/SCAI/SIR/STS/SVM Guidelines for the Diagnosis and Management of Patients with Thoracic Aortic Disease. Circulation. 2010; [id number]. Aortic aneurysm NOS (ICD10-I71.9) Mild mosaic attenuation with low lung volumes, of uncertain significance associated with mild airway narrowing, correlate with any respiratory symptoms with high-resolution chest CT on follow-up as warranted. Constellation of findings with mosaic attenuation and airway narrowing could be seen in the setting of but is not specific for bronchomalacia. Electronically Signed: By: Zetta Bills M.D. On: 01/15/2021 17:11    Assessment & Plan:   Problem List Items Addressed This Visit     Chronic UTI    Check UA      Coronary atherosclerosis    Cont on aspirin and fish oil      DM2 (diabetes mellitus, type 2) ([place])    On Metformin, Prandin Actos 15 mg daily added 7/21   cardiac CT scan for calcium scoring was offered 1/20, 1/21  1/22 CT coronary calcium score is 2.75         No orders of the defined types were placed in this encounter.     Follow-up: No follow-ups on file.  Walker Kehr, MD

## 2021-12-24 NOTE — Assessment & Plan Note (Signed)
Cont on aspirin and fish oil 

## 2021-12-24 NOTE — Assessment & Plan Note (Signed)
On Metformin, Prandin Actos 15 mg daily added 7/21  cardiac CT scan for calcium scoring was offered 1/20, 1/21  1/22 CT coronary calcium score is 2.75

## 2021-12-24 NOTE — Addendum Note (Signed)
Addended by: Earnstine Regal on: 12/24/2021 04:44 PM   Modules accepted: Orders

## 2021-12-25 LAB — COMPREHENSIVE METABOLIC PANEL
ALT: 19 U/L (ref 0–53)
AST: 18 U/L (ref 0–37)
Albumin: 4.5 g/dL (ref 3.5–5.2)
Alkaline Phosphatase: 45 U/L (ref 39–117)
BUN: 19 mg/dL (ref 6–23)
CO2: 26 mEq/L (ref 19–32)
Calcium: 9.8 mg/dL (ref 8.4–10.5)
Chloride: 103 mEq/L (ref 96–112)
Creatinine, Ser: 0.95 mg/dL (ref 0.40–1.50)
GFR: 81.37 mL/min (ref >60.00)
Glucose, Bld: 94 mg/dL (ref 70–99)
Potassium: 4.3 mEq/L (ref 3.5–5.1)
Sodium: 140 mEq/L (ref 135–145)
Total Bilirubin: 0.4 mg/dL (ref 0.2–1.2)
Total Protein: 7.6 g/dL (ref 6.0–8.3)

## 2021-12-25 LAB — URINALYSIS, ROUTINE W REFLEX MICROSCOPIC
Bilirubin Urine: NEGATIVE
Ketones, ur: NEGATIVE
Nitrite: NEGATIVE
Specific Gravity, Urine: >=1.03 — AB (ref 1.000–1.030)
Total Protein, Urine: NEGATIVE
Urine Glucose: NEGATIVE
Urobilinogen, UA: 0.2 (ref 0.0–1.0)
pH: 5.5 (ref 5.0–8.0)

## 2021-12-25 LAB — HEMOGLOBIN A1C: Hgb A1c MFr Bld: 7.3 % — ABNORMAL HIGH (ref 4.6–6.5)

## 2022-02-15 ENCOUNTER — Encounter (HOSPITAL_COMMUNITY): Payer: Self-pay | Admitting: Radiology

## 2022-03-21 ENCOUNTER — Other Ambulatory Visit: Payer: Self-pay

## 2022-03-21 ENCOUNTER — Encounter (HOSPITAL_BASED_OUTPATIENT_CLINIC_OR_DEPARTMENT_OTHER): Payer: Self-pay | Admitting: Emergency Medicine

## 2022-03-21 ENCOUNTER — Emergency Department (HOSPITAL_BASED_OUTPATIENT_CLINIC_OR_DEPARTMENT_OTHER)
Admission: EM | Admit: 2022-03-21 | Discharge: 2022-03-21 | Disposition: A | Discharge status: Home / Self Care | Payer: Federal, State, Local not specified - PPO | Attending: Emergency Medicine | Admitting: Emergency Medicine

## 2022-03-21 DIAGNOSIS — Z7982 Long term (current) use of aspirin: Secondary | ICD-10-CM | POA: Insufficient documentation

## 2022-03-21 DIAGNOSIS — R319 Hematuria, unspecified: Secondary | ICD-10-CM | POA: Insufficient documentation

## 2022-03-21 DIAGNOSIS — R399 Unspecified symptoms and signs involving the genitourinary system: Secondary | ICD-10-CM

## 2022-03-21 DIAGNOSIS — N390 Urinary tract infection, site not specified: Secondary | ICD-10-CM | POA: Diagnosis not present

## 2022-03-21 LAB — URINALYSIS, ROUTINE W REFLEX MICROSCOPIC
Bilirubin Urine: NEGATIVE
Glucose, UA: NEGATIVE mg/dL
Ketones, ur: NEGATIVE mg/dL
Nitrite: NEGATIVE
Specific Gravity, Urine: 1.017 (ref 1.005–1.030)
pH: 6 (ref 5.0–8.0)

## 2022-03-21 LAB — BASIC METABOLIC PANEL
Anion gap: 10 (ref 5–15)
BUN: 17 mg/dL (ref 8–23)
CO2: 27 mmol/L (ref 22–32)
Calcium: 9.9 mg/dL (ref 8.9–10.3)
Chloride: 100 mmol/L (ref 98–111)
Creatinine, Ser: 0.96 mg/dL (ref 0.61–1.24)
GFR, Estimated: >60 mL/min (ref >60)
Glucose, Bld: 116 mg/dL — ABNORMAL HIGH (ref 70–99)
Potassium: 3.8 mmol/L (ref 3.5–5.1)
Sodium: 137 mmol/L (ref 135–145)

## 2022-03-21 LAB — CBC
HCT: 44.2 % (ref 39.0–52.0)
Hemoglobin: 14.9 g/dL (ref 13.0–17.0)
MCH: 31.4 pg (ref 26.0–34.0)
MCHC: 33.7 g/dL (ref 30.0–36.0)
MCV: 93.2 fL (ref 80.0–100.0)
Platelets: 222 10*3/uL (ref 150–400)
RBC: 4.74 MIL/uL (ref 4.22–5.81)
RDW: 13.1 % (ref 11.5–15.5)
WBC: 10 10*3/uL (ref 4.0–10.5)
nRBC: 0 % (ref 0.0–0.2)

## 2022-03-21 MED ORDER — CEPHALEXIN 250 MG PO CAPS
500.0000 mg | ORAL_CAPSULE | Freq: Once | ORAL | Status: AC
Start: 2022-03-21 — End: 2022-03-21
  Administered 2022-03-21: 500 mg via ORAL
  Filled 2022-03-21: qty 2

## 2022-03-21 MED ORDER — CEPHALEXIN 500 MG PO CAPS
500.0000 mg | ORAL_CAPSULE | Freq: Two times a day (BID) | ORAL | 0 refills | Status: AC
Start: 2022-03-21 — End: 2022-03-28

## 2022-03-21 NOTE — ED Provider Notes (Signed)
? ?[facility]  ?Provider Note ? ?CSN: [id number] ?Arrival date & time: 03/21/22 1953 ? ?History ?Chief Complaint  ?Patient presents with  ? Hematuria  ? ? ?[name] is a 71 y.o. male reports a history of microscopic hematuria that his Urologist has told him 'wasn't enough to worry about' and so he has not had it worked up before. He reports today he had gross hematuria which is unusual for him. Denies any fever, vomiting, back pain, dysuria, or frequency. Review of EMR notes 'chronic UTI' in PCP note from earlier this year, but he denies ever having taken antibiotics for his urine. He has no Urine cultures in the last 10 years available in Epic.  ? ? ?Home Medications ?Prior to Admission medications   ?Medication Sig Start Date End Date Taking? Authorizing Provider  ?aspirin 81 MG EC tablet Take 81 mg by mouth daily.   Yes [provider]  ?cephALEXin (KEFLEX) 500 MG capsule Take 1 capsule (500 mg total) by mouth 2 (two) times daily for 7 days. 03/21/22 03/28/22 Yes Pollyann Savoy, MD  ?Cholecalciferol (VITAMIN D) 2000 units CAPS Take 2,000 Units by mouth daily.   Yes [provider]  ?metFORMIN (GLUCOPHAGE) 1000 MG tablet Take 1 tablet (1,000 mg total) by mouth 2 (two) times daily with a meal. 06/13/21  Yes Plotnikov, Georgina Quint, MD  ?Omega-3 Fatty Acids (FISH OIL) 1000 MG CAPS Take 1 capsule by mouth 2 (two) times daily.   Yes [provider]  ?pioglitazone (ACTOS) 15 MG tablet TAKE 1 TABLET(15 MG) BY MOUTH DAILY 06/26/21  Yes Plotnikov, Georgina Quint, MD  ?repaglinide (PRANDIN) 2 MG tablet TAKE 1 TABLET(2 MG) BY MOUTH THREE TIMES DAILY BEFORE MEALS 09/11/21  Yes Plotnikov, Georgina Quint, MD  ?cyclobenzaprine (FLEXERIL) 5 MG tablet Take 1-2 tablets (5-10 mg total) by mouth 3 times/day as needed-between meals & bedtime for muscle spasms. 01/06/20   Plotnikov, Georgina Quint, MD  ? ? ? ?Allergies    ?Patient has no known allergies. ? ? ?Review of Systems   ?Review of  Systems ?Please see HPI for pertinent positives and negatives ? ?Physical Exam ?BP (!) 144/85 (BP Location: Right Arm)   Pulse 69   Temp 98.3 ?F (36.8 ?C) (Oral)   Resp 16   Ht 5\' 11"  (1.803 m)   Wt 96.2 kg   SpO2 97%   BMI 29.57 kg/m?  ? ?Physical Exam ?Vitals and nursing note reviewed.  ?Constitutional:   ?   Appearance: Normal appearance.  ?HENT:  ?   Head: Normocephalic and atraumatic.  ?   Nose: Nose normal.  ?   Mouth/Throat:  ?   Mouth: Mucous membranes are moist.  ?Eyes:  ?   Extraocular Movements: Extraocular movements intact.  ?   Conjunctiva/sclera: Conjunctivae normal.  ?Cardiovascular:  ?   Rate and Rhythm: Normal rate.  ?Pulmonary:  ?   Effort: Pulmonary effort is normal.  ?   Breath sounds: Normal breath sounds.  ?Abdominal:  ?   General: Abdomen is flat.  ?   Palpations: Abdomen is soft.  ?   Tenderness: There is no abdominal tenderness.  ?Musculoskeletal:     ?   General: No swelling. Normal range of motion.  ?   Cervical back: Neck supple.  ?Skin: ?   General: Skin is warm and dry.  ?   Findings: No rash (on exposed skin).  ?Neurological:  ?   General: No focal deficit present.  ?  Mental Status: He is alert and oriented to person, place, and time.  ?Psychiatric:     ?   Mood and Affect: Mood normal.  ? ? ?ED Results / Procedures / Treatments   ?EKG ?None ? ?Procedures ?Procedures ? ?Medications Ordered in the ED ?Medications  ?cephALEXin (KEFLEX) capsule 500 mg (has no administration in time range)  ? ? ?Initial Impression and Plan ? Patient with painless gross hematuria, CBC is normal, BMP is negative. UA with hematuria and signs of infection. Given his change in amount of blood and concern for infection, will send for culture and start Keflex. He was advised to follow up with Urology for further evaluation. RTED for any worsening bleeding, pain, fever, vomiting or other concerns.  ? ?ED Course  ? ?  ? ? ?MDM Rules/Calculators/A&P ?Medical Decision Making ?Problems Addressed: ?Hematuria,  unspecified type: chronic illness or injury with exacerbation, progression, or side effects of treatment ?Lower urinary tract symptoms (LUTS): acute illness or injury ? ?Amount and/or Complexity of Data Reviewed ?Labs: ordered. Decision-making details documented in ED Course. ? ?Risk ?Prescription drug management. ? ? ? ?Final Clinical Impression(s) / ED Diagnoses ?Final diagnoses:  ?Hematuria, unspecified type  ?Lower urinary tract symptoms (LUTS)  ? ? ?Rx / DC Orders ?ED Discharge Orders   ? ?      Ordered  ?  cephALEXin (KEFLEX) 500 MG capsule  2 times daily       ? 03/21/22 2324  ? ?  ?  ? ?  ? ?  ?Pollyann Savoy, MD ?03/21/22 2325 ? ?

## 2022-03-21 NOTE — ED Notes (Signed)
EMT-P provided AVS using Teachback Method. Patient verbalizes understanding of Discharge Instructions. Opportunity for Questioning and Answers were provided by EMT-P. Patient Discharged from ED.  ? ?

## 2022-03-21 NOTE — ED Triage Notes (Signed)
Pt via pov from home with hematuria today. Pt states he has been seeing a urologist for years and has been told there is a small amount of blood, but today it is very bloody. Denies any other urinary symptoms, including pain. Pt is type 2 diabetic. Pt a& o x4, nad noted.  ?

## 2022-03-23 LAB — URINE CULTURE: Culture: >=100000 — AB

## 2022-03-24 ENCOUNTER — Telehealth: Payer: Self-pay | Admitting: Emergency Medicine

## 2022-03-24 NOTE — Telephone Encounter (Signed)
Post ED Visit - Positive Culture Follow-up ? ?Culture report reviewed by antimicrobial stewardship pharmacist: ?[facility] ?[]  , Enzo Bi.D. ?[]  [address], .D., BCPS AQ-ID ?[]  Celedonio Miyamoto, Pharm.D., BCPS ?[]  [address], .D., BCPS ?[]  [place], .D., BCPS, AAHIVP ?[]  Georgina Pillion, Pharm.D., BCPS, AAHIVP ?[]  [address], PharmD, BCPS ?[]  , PharmD, BCPS ?[]  [place], PharmD, BCPS ?[x]  [address], PharmD ?[]  , PharmD, BCPS ?[]  Estella Husk, PharmD ? ? [facility] ?[]  Lysle Pearl, PharmD ?[]  , PharmD ?[]  Phillips Climes, PharmD ?[]  , Rph ?[]  Agapito Games) , PharmD ?[]  Delmar Landau, PharmD ?[]  , PharmD ?[]  Mervyn Gay, PharmD ?[]  , PharmD ?[]  Vinnie Level, PharmD ?[]  Gerri Spore, PharmD ?[]  , PharmD ?[]  Len Childs, PharmD ? ? ?Positive urine culture ?Treated with Cephalexin, organism sensitive to the same and no further patient follow-up is required at this time. ? ? ?03/24/2022, 7:15 PM ?  ?

## 2022-04-04 DIAGNOSIS — B9620 Unspecified Escherichia coli [E. coli] as the cause of diseases classified elsewhere: Secondary | ICD-10-CM | POA: Diagnosis not present

## 2022-04-04 DIAGNOSIS — N390 Urinary tract infection, site not specified: Secondary | ICD-10-CM | POA: Diagnosis not present

## 2022-04-04 DIAGNOSIS — R319 Hematuria, unspecified: Secondary | ICD-10-CM | POA: Diagnosis not present

## 2022-04-04 DIAGNOSIS — R310 Gross hematuria: Secondary | ICD-10-CM | POA: Diagnosis not present

## 2022-04-04 DIAGNOSIS — I700 Atherosclerosis of aorta: Secondary | ICD-10-CM | POA: Diagnosis not present

## 2022-04-04 DIAGNOSIS — R8271 Bacteriuria: Secondary | ICD-10-CM | POA: Diagnosis not present

## 2022-05-06 ENCOUNTER — Encounter: Payer: Self-pay | Admitting: Internal Medicine

## 2022-05-12 DIAGNOSIS — R1310 Dysphagia, unspecified: Secondary | ICD-10-CM | POA: Diagnosis not present

## 2022-05-12 DIAGNOSIS — J029 Acute pharyngitis, unspecified: Secondary | ICD-10-CM | POA: Diagnosis not present

## 2022-05-17 DIAGNOSIS — R8279 Other abnormal findings on microbiological examination of urine: Secondary | ICD-10-CM | POA: Diagnosis not present

## 2022-05-17 DIAGNOSIS — R8271 Bacteriuria: Secondary | ICD-10-CM | POA: Diagnosis not present

## 2022-06-08 ENCOUNTER — Other Ambulatory Visit: Payer: Self-pay | Admitting: Internal Medicine

## 2022-06-10 ENCOUNTER — Other Ambulatory Visit: Payer: Self-pay | Admitting: Internal Medicine

## 2022-06-17 ENCOUNTER — Other Ambulatory Visit: Payer: Self-pay | Admitting: Internal Medicine

## 2022-06-17 DIAGNOSIS — R310 Gross hematuria: Secondary | ICD-10-CM | POA: Diagnosis not present

## 2022-06-17 DIAGNOSIS — B9620 Unspecified Escherichia coli [E. coli] as the cause of diseases classified elsewhere: Secondary | ICD-10-CM | POA: Diagnosis not present

## 2022-06-17 DIAGNOSIS — N390 Urinary tract infection, site not specified: Secondary | ICD-10-CM | POA: Diagnosis not present

## 2022-06-17 DIAGNOSIS — N400 Enlarged prostate without lower urinary tract symptoms: Secondary | ICD-10-CM | POA: Diagnosis not present

## 2022-06-24 ENCOUNTER — Encounter: Payer: Self-pay | Admitting: Internal Medicine

## 2022-06-24 ENCOUNTER — Ambulatory Visit: Payer: Federal, State, Local not specified - PPO | Admitting: Internal Medicine

## 2022-06-24 VITALS — BP 122/78 | HR 87 | Temp 98.2°F | Ht 71.0 in | Wt 208.0 lb

## 2022-06-24 DIAGNOSIS — I2583 Coronary atherosclerosis due to lipid rich plaque: Secondary | ICD-10-CM

## 2022-06-24 DIAGNOSIS — E119 Type 2 diabetes mellitus without complications: Secondary | ICD-10-CM | POA: Diagnosis not present

## 2022-06-24 DIAGNOSIS — E785 Hyperlipidemia, unspecified: Secondary | ICD-10-CM

## 2022-06-24 DIAGNOSIS — N390 Urinary tract infection, site not specified: Secondary | ICD-10-CM

## 2022-06-24 DIAGNOSIS — I2510 Atherosclerotic heart disease of native coronary artery without angina pectoris: Secondary | ICD-10-CM | POA: Diagnosis not present

## 2022-06-24 LAB — COMPREHENSIVE METABOLIC PANEL
ALT: 20 U/L (ref 0–53)
AST: 19 U/L (ref 0–37)
Albumin: 4.7 g/dL (ref 3.5–5.2)
Alkaline Phosphatase: 50 U/L (ref 39–117)
BUN: 14 mg/dL (ref 6–23)
CO2: 26 mEq/L (ref 19–32)
Calcium: 9.9 mg/dL (ref 8.4–10.5)
Chloride: 103 mEq/L (ref 96–112)
Creatinine, Ser: 1.11 mg/dL (ref 0.40–1.50)
GFR: 67.27 mL/min (ref >60.00)
Glucose, Bld: 102 mg/dL — ABNORMAL HIGH (ref 70–99)
Potassium: 4 mEq/L (ref 3.5–5.1)
Sodium: 140 mEq/L (ref 135–145)
Total Bilirubin: 0.5 mg/dL (ref 0.2–1.2)
Total Protein: 7.9 g/dL (ref 6.0–8.3)

## 2022-06-24 LAB — TSH: TSH: 1.26 u[IU]/mL (ref 0.35–5.50)

## 2022-06-24 LAB — HEMOGLOBIN A1C: Hgb A1c MFr Bld: 7.3 % — ABNORMAL HIGH (ref 4.6–6.5)

## 2022-06-24 MED ORDER — METFORMIN HCL 1000 MG PO TABS: 1000.0000 mg | ORAL_TABLET | Freq: Two times a day (BID) | ORAL | 3 refills | Status: DC

## 2022-06-24 MED ORDER — PIOGLITAZONE HCL 15 MG PO TABS: 15.0000 mg | ORAL_TABLET | Freq: Every day | ORAL | 3 refills | Status: DC

## 2022-06-24 MED ORDER — REPAGLINIDE 2 MG PO TABS: 2.0000 mg | ORAL_TABLET | Freq: Three times a day (TID) | ORAL | 3 refills | Status: DC

## 2022-06-24 NOTE — Assessment & Plan Note (Addendum)
Monitor for sx's On abx now per Urology

## 2022-06-24 NOTE — Progress Notes (Signed)
Subjective:  Patient ID: [name], male    DOB: March 01, 1951  Age: 71 y.o. MRN: [id number]  CC: No chief complaint on file.   HPI [name] presents for DM, UTIs, dyslipidemia  Outpatient Medications Prior to Visit  Medication Sig Dispense Refill  . amoxicillin-clavulanate (AUGMENTIN) 500-125 MG tablet Take by mouth.    Marland Kitchen aspirin 81 MG EC tablet Take 81 mg by mouth daily.    . Cholecalciferol (VITAMIN D) 2000 units CAPS Take 2,000 Units by mouth daily.    . cyclobenzaprine (FLEXERIL) 5 MG tablet Take 1-2 tablets (5-10 mg total) by mouth 3 times/day as needed-between meals & bedtime for muscle spasms. 60 tablet 3  . Omega-3 Fatty Acids (FISH OIL) 1000 MG CAPS Take 1 capsule by mouth 2 (two) times daily.    . metFORMIN (GLUCOPHAGE) 1000 MG tablet TAKE 1 TABLET BY MOUTH TWICE DAILY WITH A MEAL, MUST SEE DOCTOR IN JULY FOR FUTURE REFILLS 180 tablet 0  . pioglitazone (ACTOS) 15 MG tablet Take 1 tablet (15 mg total) by mouth daily. Annual appt due in July must see provider for future refills 30 tablet 1  . repaglinide (PRANDIN) 2 MG tablet Take 1 tablet (2 mg total) by mouth 3 (three) times daily before meals. Annual appt due in July must see provider for future refills 270 tablet 0   No facility-administered medications prior to visit.    ROS: Review of Systems  Objective:  BP 122/78 (BP Location: Left Arm, Patient Position: Sitting, Cuff Size: Large)   Pulse 87   Temp 98.2 F (36.8 C) (Oral)   Ht 5\' 11"  (1.803 m)   Wt 208 lb (94.3 kg)   SpO2 96%   BMI 29.01 kg/m   BP Readings from Last 3 Encounters:  06/24/22 122/78  03/21/22 (!) 144/85  12/24/21 130/82    Wt Readings from Last 3 Encounters:  06/24/22 208 lb (94.3 kg)  03/21/22 212 lb (96.2 kg)  12/24/21 212 lb 9.6 oz (96.4 kg)    Physical Exam  Lab Results  Component Value Date   WBC 10.0 03/21/2022   HGB 14.9 03/21/2022   HCT 44.2 03/21/2022   PLT 222 03/21/2022   GLUCOSE 116 (H) 03/21/2022   CHOL 191  06/22/2021   TRIG 71.0 06/22/2021   HDL 54.80 06/22/2021   LDLDIRECT 116.0 01/06/2020   LDLCALC 122 (H) 06/22/2021   ALT 19 12/24/2021   AST 18 12/24/2021   NA 137 03/21/2022   K 3.8 03/21/2022   CL 100 03/21/2022   CREATININE 0.96 03/21/2022   BUN 17 03/21/2022   CO2 27 03/21/2022   TSH 0.75 06/22/2021   PSA 0.82 06/22/2021   HGBA1C 7.3 (H) 12/24/2021   MICROALBUR 7.4 (H) 01/06/2020    No results found.  Assessment & Plan:   Problem List Items Addressed This Visit     Chronic UTI    Monitor for sx's On abx now per Urology      Coronary atherosclerosis    Cont on aspirin and fish oil      DM2 (diabetes mellitus, type 2) ([facility])    On Metformin, Prandin, Actos      Relevant Medications   pioglitazone (ACTOS) 15 MG tablet   metFORMIN (GLUCOPHAGE) 1000 MG tablet   repaglinide (PRANDIN) 2 MG tablet   Other Relevant Orders   Comprehensive metabolic panel   Hemoglobin A1c   TSH   Dyslipidemia - Primary   Relevant Orders   Comprehensive metabolic panel  TSH      Meds ordered this encounter  Medications  . pioglitazone (ACTOS) 15 MG tablet    Sig: Take 1 tablet (15 mg total) by mouth daily.    Dispense:  90 tablet    Refill:  3  . metFORMIN (GLUCOPHAGE) 1000 MG tablet    Sig: Take 1 tablet (1,000 mg total) by mouth 2 (two) times daily with a meal.    Dispense:  180 tablet    Refill:  3  . repaglinide (PRANDIN) 2 MG tablet    Sig: Take 1 tablet (2 mg total) by mouth 3 (three) times daily before meals.    Dispense:  270 tablet    Refill:  3      Follow-up: Return in about 6 months (around 12/25/2022) for Wellness Exam.  Sonda Primes, MD

## 2022-06-24 NOTE — Assessment & Plan Note (Signed)
Cont on aspirin and fish oil 

## 2022-06-24 NOTE — Assessment & Plan Note (Signed)
On Metformin, Prandin, Actos

## 2022-06-28 IMAGING — CT CT CARDIAC CORONARY ARTERY CALCIUM SCORE
3 series · 14 of 20 positions shown, 15 images · non-contrast
Comparison: None
COMPARISON: None

Addendum:
EXAM:
OVER-READ INTERPRETATION  CT CHEST

The following report is an over-read performed by radiologist Dr.
over-read does not include interpretation of cardiac or coronary
anatomy or pathology. The coronary calcium score interpretation by
the cardiologist is attached.
CLINICAL DATA: Risk stratification
Coronary Calcium Score
TECHNIQUE: The patient was scanned on a [employer] scanner. Axial
non-contrast 3 mm slices were carried out through the heart. The
data set was analyzed on a dedicated work station and scored using
the Agatson method.

[Series 2: casc 3.0 bv41 2 bestsyst 35 % · axial · 0.39mm/px · z∈[-180,-105]mm · 4 of 43 slices shown, 5 images]
[im 9/43  vessel]
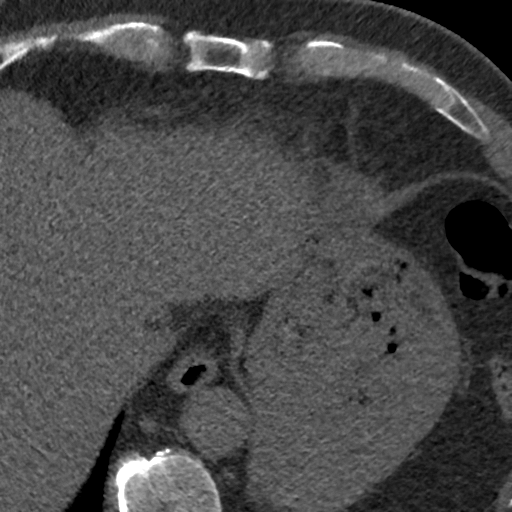
[im 9/43  lung]
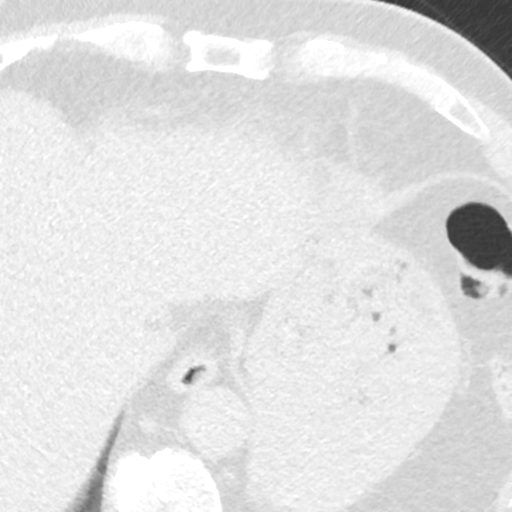
[im 17/43  vessel]
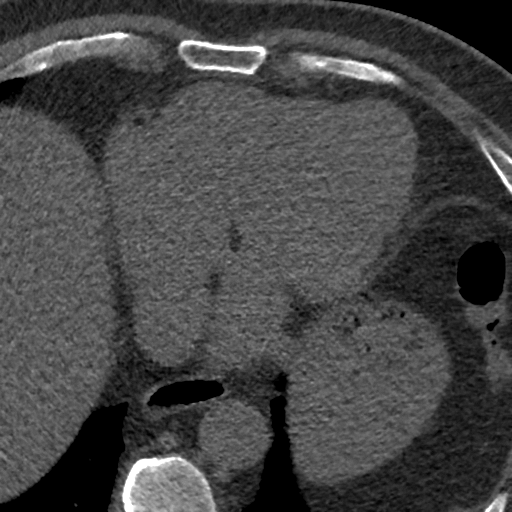
[im 26/43  vessel]
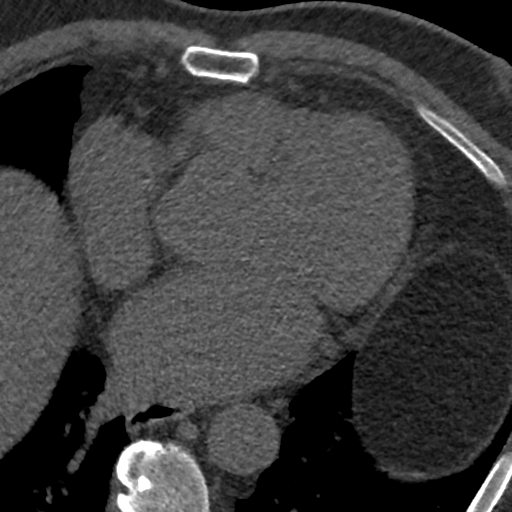
[im 34/43  vessel]
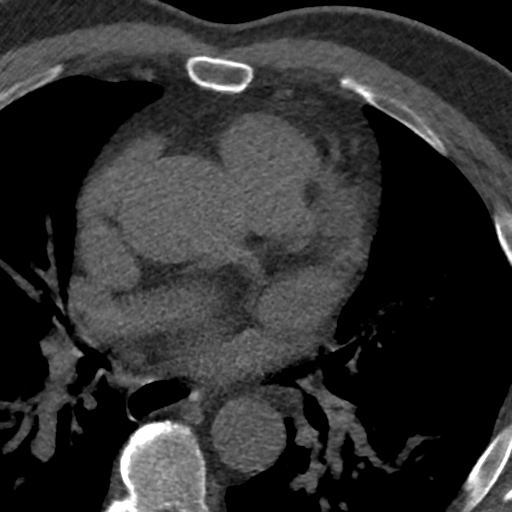

[Series 3: lung 36 % · axial · 0.72mm/px · z∈[-183,-99]mm · 5 of 43 slices shown]
[im 8/43  lung]
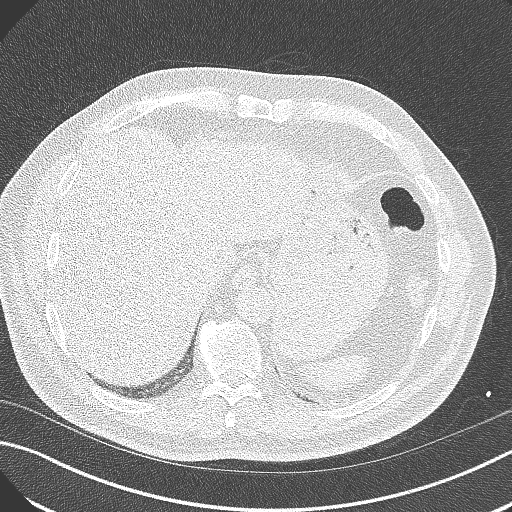
[im 15/43  lung]
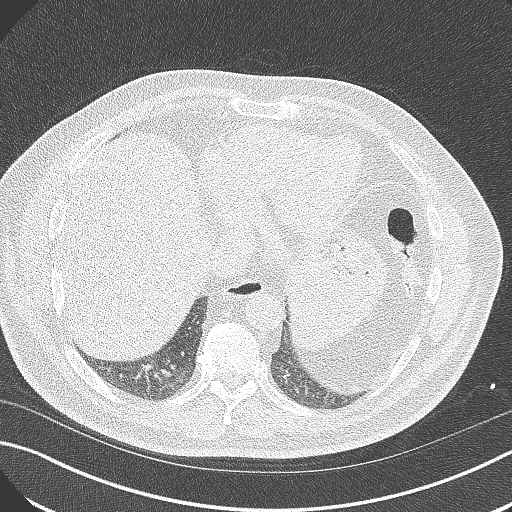
[im 22/43  lung]
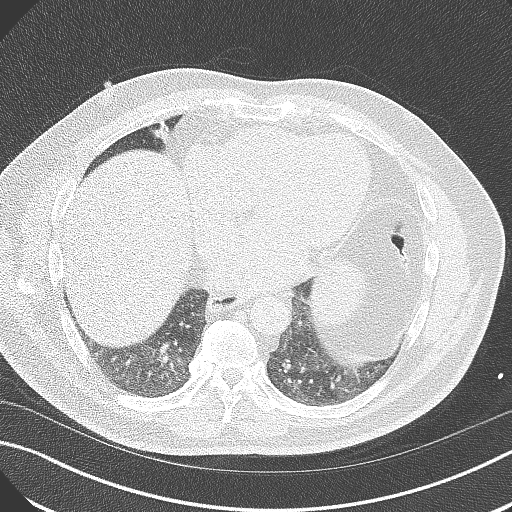
[im 29/43  lung]
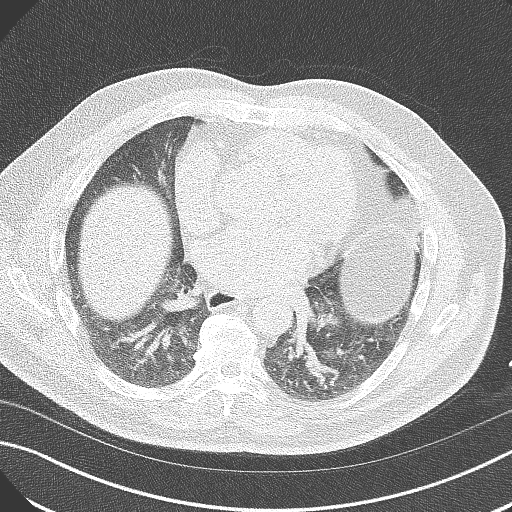
[im 36/43  lung]
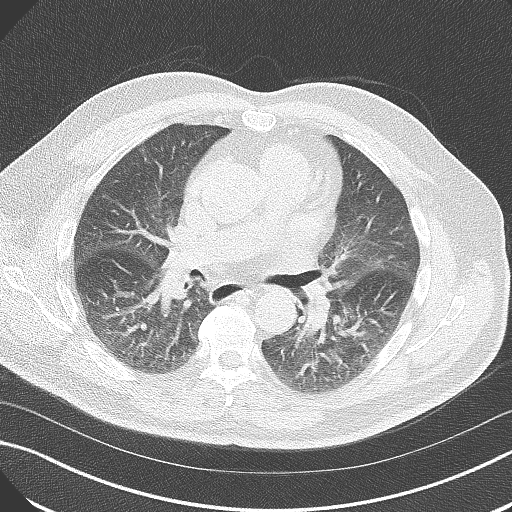

[Series 4: lung st 36 % · axial · 0.72mm/px · z∈[-183,-99]mm · 5 of 43 slices shown]
[im 8/43  lung]
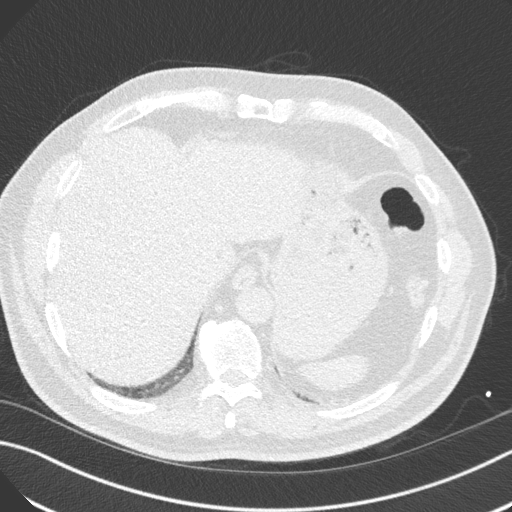
[im 15/43  lung]
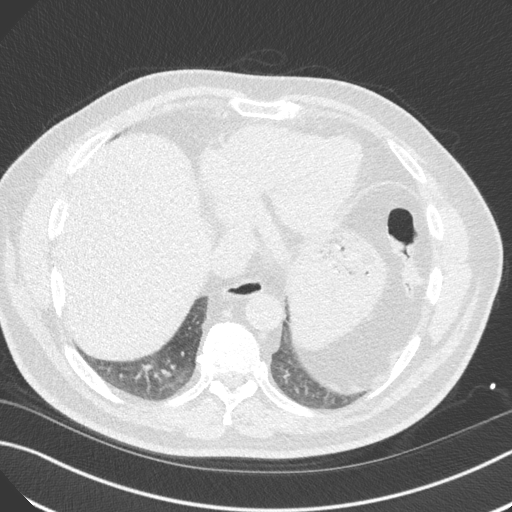
[im 22/43  lung]
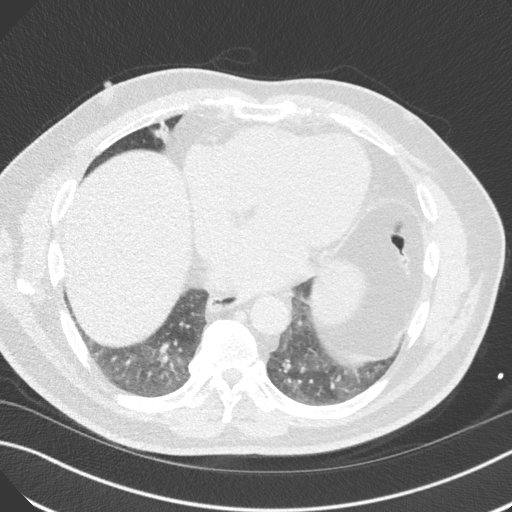
[im 29/43  lung]
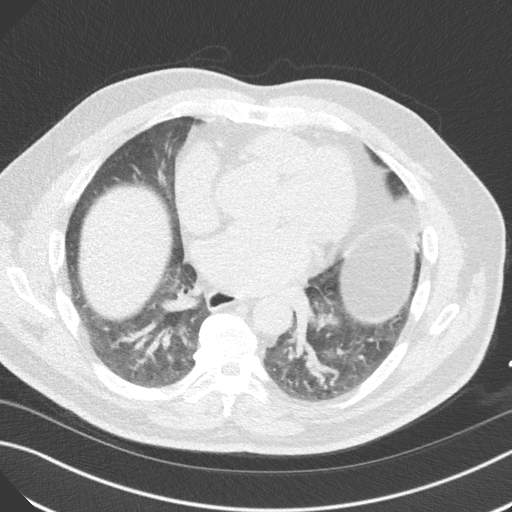
[im 36/43  lung]
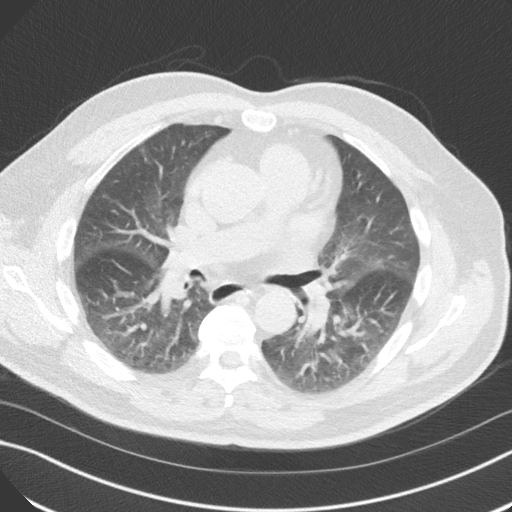

[14 of 20 positions shown; findings below may reference images not displayed]

FINDINGS: Vascular: Please see dedicated coronary calcium scoring for
additional detail regarding cardiac structures. There is
approximately 4 cm caliber of the ascending thoracic aorta as
measured in the coronal plane on image 42 series 5. There is no
visible calcified atherosclerosis of the thoracic aorta. The central
pulmonary vasculature 3.2 cm.

Mediastinum/Nodes: Patulous esophagus. No adenopathy in the chest as
visualized, from the mid chest through the lung bases.

Lungs/Pleura: Basilar atelectasis. Mild bronchial narrowing of
mainstem bronchi bilaterally and signs of mosaic attenuation. No
effusion. No consolidative changes.

Upper Abdomen: Incidental imaging of upper abdominal contents with
signs of hepatic steatosis and nodular hepatic contour.

Musculoskeletal: No acute musculoskeletal process. Spinal
degenerative changes. Limited imaging of the chest.
IMPRESSION: 4 cm caliber of the ascending thoracic aorta may be mildly
aneurysmal. Recommend annual imaging followup by CTA or MRA. This
recommendation follows 8282
ACCF/AHA/AATS/ACR/ASA/SCA/CEOLA/DAVEON/ATSOU/KRIBI Guidelines for the
Diagnosis and Management of Patients with Thoracic Aortic Disease.
Circulation. 8282; 121: E266-e369. Aortic aneurysm NOS (KECE6-MX8.D)

Mild mosaic attenuation with low lung volumes, of uncertain
significance associated with mild airway narrowing, correlate with
any respiratory symptoms with high-resolution chest CT on follow-up
as warranted. Constellation of findings with mosaic attenuation and
airway narrowing could be seen in the setting of but is not specific
for bronchomalacia.
FINDINGS: Non-cardiac: See separate report from [REDACTED].

Ascending Aorta: Borderline dilated at 4.0 cm (non-contrast),
however, there is motion artifact.

Pericardium: Normal

Coronary arteries: Normal origins
IMPRESSION: Coronary calcium score of 2.75. This was 36th percentile for age and
sex matched control.

Borderline dilated aortic root at 4.0 cm (motion artifact is noted,
non-contrast)

This is a low risk calcium score given age.

*** End of Addendum ***
EXAM:
OVER-READ INTERPRETATION  CT CHEST

The following report is an over-read performed by radiologist Dr.
over-read does not include interpretation of cardiac or coronary
anatomy or pathology. The coronary calcium score interpretation by
the cardiologist is attached.
FINDINGS: Vascular: Please see dedicated coronary calcium scoring for
additional detail regarding cardiac structures. There is
approximately 4 cm caliber of the ascending thoracic aorta as
measured in the coronal plane on image 42 series 5. There is no
visible calcified atherosclerosis of the thoracic aorta. The central
pulmonary vasculature 3.2 cm.

Mediastinum/Nodes: Patulous esophagus. No adenopathy in the chest as
visualized, from the mid chest through the lung bases.

Lungs/Pleura: Basilar atelectasis. Mild bronchial narrowing of
mainstem bronchi bilaterally and signs of mosaic attenuation. No
effusion. No consolidative changes.

Upper Abdomen: Incidental imaging of upper abdominal contents with
signs of hepatic steatosis and nodular hepatic contour.

Musculoskeletal: No acute musculoskeletal process. Spinal
degenerative changes. Limited imaging of the chest.
IMPRESSION: 4 cm caliber of the ascending thoracic aorta may be mildly
aneurysmal. Recommend annual imaging followup by CTA or MRA. This
recommendation follows 8282
ACCF/AHA/AATS/ACR/ASA/SCA/CEOLA/DAVEON/ATSOU/KRIBI Guidelines for the
Diagnosis and Management of Patients with Thoracic Aortic Disease.
Circulation. 8282; 121: E266-e369. Aortic aneurysm NOS (KECE6-MX8.D)

Mild mosaic attenuation with low lung volumes, of uncertain
significance associated with mild airway narrowing, correlate with
any respiratory symptoms with high-resolution chest CT on follow-up
as warranted. Constellation of findings with mosaic attenuation and
airway narrowing could be seen in the setting of but is not specific
for bronchomalacia.

## 2022-07-01 DIAGNOSIS — R310 Gross hematuria: Secondary | ICD-10-CM | POA: Diagnosis not present

## 2022-07-01 DIAGNOSIS — N400 Enlarged prostate without lower urinary tract symptoms: Secondary | ICD-10-CM | POA: Diagnosis not present

## 2022-08-05 DIAGNOSIS — N390 Urinary tract infection, site not specified: Secondary | ICD-10-CM | POA: Diagnosis not present

## 2022-08-05 DIAGNOSIS — N400 Enlarged prostate without lower urinary tract symptoms: Secondary | ICD-10-CM | POA: Diagnosis not present

## 2022-08-05 DIAGNOSIS — R310 Gross hematuria: Secondary | ICD-10-CM | POA: Diagnosis not present

## 2022-08-08 ENCOUNTER — Ambulatory Visit: Payer: Federal, State, Local not specified - PPO | Admitting: Internal Medicine

## 2022-08-08 ENCOUNTER — Encounter: Payer: Self-pay | Admitting: Internal Medicine

## 2022-08-08 VITALS — BP 120/78 | HR 105 | Ht 71.0 in | Wt 208.0 lb

## 2022-08-08 DIAGNOSIS — Z86010 Personal history of colonic polyps: Secondary | ICD-10-CM

## 2022-08-08 NOTE — Patient Instructions (Signed)
You have been scheduled for a colonoscopy. Please follow written instructions given to you at your visit today.  Please pick up your prep supplies at the pharmacy within the next 1-3 days. If you use inhalers (even only as needed), please bring them with you on the day of your procedure.  Due to recent changes in healthcare laws, you may see the results of your imaging and laboratory studies on MyChart before your provider has had a chance to review them.  We understand that in some cases there may be results that are confusing or concerning to you. Not all laboratory results come back in the same time frame and the provider may be waiting for multiple results in order to interpret others.  Please give [place] 48 hours in order for your provider to thoroughly review all the results before contacting the office for clarification of your results.   Keep on Singing Sir.   I appreciate the opportunity to care for you. Stan Head, MD, [facility]

## 2022-08-08 NOTE — Progress Notes (Signed)
   [name] 71 y.o. March 19, 1951 [id number]  Assessment & Plan:   Encounter Diagnosis  Name Primary?   Hx of adenomatous colonic polyps Yes    Schedule surveillance colonoscopy.  Risks benefits and indications have been reviewed.  He understands and agrees to proceed.    Subjective:   Chief Complaint: History of colon polyps  HPI 71 year old African-American male with a history of polyps as outlined below.  He does not have any active GI problems.  He decided to present to the office to review intended and recommended colonoscopy (he received a letter recommending it).  Colonoscopy 03/07/2015 ENDOSCOPIC IMPRESSION: Normal colonoscopy - good prep RECOMMENDATIONS: Repeat Colonoscopy in 7 years. (2003 - 9 mm adenoma and possible other adenomas (max 3) with 4 diminutive polyps destroyed 2005 no polyps 2010 4 mm distal hyperplastic polyp)  Lab Results  Component Value Date   WBC 10.0 03/21/2022   HGB 14.9 03/21/2022   HCT 44.2 03/21/2022   MCV 93.2 03/21/2022   PLT 222 03/21/2022     No Known Allergies Current Meds  Medication Sig   aspirin 81 MG EC tablet Take 81 mg by mouth daily.   Cholecalciferol (VITAMIN D) 2000 units CAPS Take 2,000 Units by mouth daily.   cyclobenzaprine (FLEXERIL) 5 MG tablet Take 1-2 tablets (5-10 mg total) by mouth 3 times/day as needed-between meals & bedtime for muscle spasms.   metFORMIN (GLUCOPHAGE) 1000 MG tablet Take 1 tablet (1,000 mg total) by mouth 2 (two) times daily with a meal.   Omega-3 Fatty Acids (FISH OIL) 1000 MG CAPS Take 1 capsule by mouth 2 (two) times daily.   pioglitazone (ACTOS) 15 MG tablet Take 1 tablet (15 mg total) by mouth daily.   repaglinide (PRANDIN) 2 MG tablet Take 1 tablet (2 mg total) by mouth 3 (three) times daily before meals.   Past Medical History:  Diagnosis Date   BPH (benign prostatic hypertrophy)    nodularity- Dr. Serena Colonel q 1 year   Diabetes mellitus type II    Hx of adenomatous colonic  polyps 10/07/2002   Hx of colonic polyp    Microscopic hematuria    hx of   Past Surgical History:  Procedure Laterality Date   CHOLECYSTECTOMY     COLONOSCOPY W/ POLYPECTOMY     Social History   Social History Narrative   Lives alone.  One daughter.  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   Review of Systems As per HPI otherwise negative  Objective:   Physical Exam @BP  120/78   Pulse (!) 105   Ht 5\' 11"  (1.803 m)   Wt 208 lb (94.3 kg)   BMI 29.01 kg/m @  General:  NAD Eyes:   anicteric Lungs:  clear Heart::  S1S2 no rubs, murmurs or gallops Abdomen:  soft and nontender, BS+    Data Reviewed:  See HPI

## 2022-08-15 DIAGNOSIS — L219 Seborrheic dermatitis, unspecified: Secondary | ICD-10-CM | POA: Diagnosis not present

## 2022-09-23 NOTE — Progress Notes (Signed)
No show

## 2022-09-24 ENCOUNTER — Ambulatory Visit: Payer: Self-pay

## 2022-09-24 ENCOUNTER — Ambulatory Visit (INDEPENDENT_AMBULATORY_CARE_PROVIDER_SITE_OTHER): Payer: Federal, State, Local not specified - PPO | Admitting: Family Medicine

## 2022-09-24 VITALS — Ht 71.0 in

## 2022-09-24 DIAGNOSIS — Z91199 Patient's noncompliance with other medical treatment and regimen due to unspecified reason: Secondary | ICD-10-CM

## 2022-09-24 DIAGNOSIS — M25512 Pain in left shoulder: Secondary | ICD-10-CM

## 2022-09-25 DIAGNOSIS — L219 Seborrheic dermatitis, unspecified: Secondary | ICD-10-CM | POA: Diagnosis not present

## 2022-10-02 NOTE — Progress Notes (Unsigned)
[facility] History and Physical   Primary Care Physician:  Tresa Garter, MD   Reason for Procedure:   Hx adenomatous colon polyps  Plan:    colonoscopy     HPI: Tion Leventhal is a 71 y.o. male here for surveillance colonoscopy in setting of hx of prior adenomatous colon polyps  (2003 - 9 mm adenoma and possible other adenomas (max 3) with 4 diminutive polyps destroyed 2005 no polyps 2010 4 mm distal hyperplastic polyp) Past Medical History:  Diagnosis Date   BPH (benign prostatic hypertrophy)    nodularity- Dr. Serena Colonel q 1 year   Diabetes mellitus type II    Hx of adenomatous colonic polyps 10/07/2002   Hx of colonic polyp    Microscopic hematuria    hx of    Past Surgical History:  Procedure Laterality Date   CHOLECYSTECTOMY     COLONOSCOPY W/ POLYPECTOMY      Prior to Admission medications   Medication Sig Start Date End Date Taking? Authorizing Provider  aspirin 81 MG EC tablet Take 81 mg by mouth daily.    [provider]  Cholecalciferol (VITAMIN D) 2000 units CAPS Take 2,000 Units by mouth daily.    [provider]  cyclobenzaprine (FLEXERIL) 5 MG tablet Take 1-2 tablets (5-10 mg total) by mouth 3 times/day as needed-between meals & bedtime for muscle spasms. 01/06/20   Plotnikov, Georgina Quint, MD  metFORMIN (GLUCOPHAGE) 1000 MG tablet Take 1 tablet (1,000 mg total) by mouth 2 (two) times daily with a meal. 06/24/22   Plotnikov, Georgina Quint, MD  Omega-3 Fatty Acids (FISH OIL) 1000 MG CAPS Take 1 capsule by mouth 2 (two) times daily.    [provider]  pioglitazone (ACTOS) 15 MG tablet Take 1 tablet (15 mg total) by mouth daily. 06/24/22   Plotnikov, Georgina Quint, MD  repaglinide (PRANDIN) 2 MG tablet Take 1 tablet (2 mg total) by mouth 3 (three) times daily before meals. 06/24/22   Plotnikov, Georgina Quint, MD    Current Outpatient Medications  Medication Sig Dispense Refill   aspirin 81 MG EC tablet Take 81 mg by mouth daily.      Cholecalciferol (VITAMIN D) 2000 units CAPS Take 2,000 Units by mouth daily.     metFORMIN (GLUCOPHAGE) 1000 MG tablet Take 1 tablet (1,000 mg total) by mouth 2 (two) times daily with a meal. 180 tablet 3   Omega-3 Fatty Acids (FISH OIL) 1000 MG CAPS Take 1 capsule by mouth 2 (two) times daily.     pioglitazone (ACTOS) 15 MG tablet Take 1 tablet (15 mg total) by mouth daily. 90 tablet 3   repaglinide (PRANDIN) 2 MG tablet Take 1 tablet (2 mg total) by mouth 3 (three) times daily before meals. 270 tablet 3   cyclobenzaprine (FLEXERIL) 5 MG tablet Take 1-2 tablets (5-10 mg total) by mouth 3 times/day as needed-between meals & bedtime for muscle spasms. (Patient not taking: Reported on 10/03/2022) 60 tablet 3   Current Facility-Administered Medications  Medication Dose Route Frequency Provider Last Rate Last Admin   0.9 %  sodium chloride infusion  500 mL Intravenous Continuous Iva Boop, MD        Allergies as of 10/03/2022   (No Known Allergies)    Family History  Problem Relation Age of Onset   Heart disease Father 15       No details   Hypertension Mother    Kidney disease Mother  ESRD   Colon cancer Neg Hx     Social History   Socioeconomic History   Marital status: Divorced    Spouse name: Not on file   Number of children: 1   Years of education: Not on file   Highest education level: Not on file  Occupational History   Not on file  Tobacco Use   Smoking status: Former    Types: Cigarettes    Quit date: 08/28/2002    Years since quitting: 20.1   Smokeless tobacco: Never  Vaping Use   Vaping Use: Never used  Substance and Sexual Activity   Alcohol use: No    Alcohol/week: 0.0 standard drinks of alcohol   Drug use: No   Sexual activity: Yes  Other Topics Concern   Not on file  Social History Narrative   Lives alone.  One daughter.  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  Physical Exam: Vital signs BP (!) 157/93   Pulse 93   Temp 98.4 F (36.9 C)   Ht 5\' 11"  (1.803 m)   Wt 208 lb (94.3 kg)   SpO2 99%   BMI 29.01 kg/m   General:   Alert,  Well-developed, well-nourished, pleasant and cooperative in NAD Lungs:  Clear throughout to auscultation.   Heart:  Regular rate and rhythm; no murmurs, clicks, rubs,  or gallops. Abdomen:  Soft, nontender and nondistended. Normal bowel sounds.   Neuro/Psych:  Alert and cooperative. Normal mood and affect. A and O x 3   @Areesha Dehaven  Simonne Maffucci, MD, [facility] [phone] (pager) 10/03/2022 11:27 AM@

## 2022-10-03 ENCOUNTER — Encounter: Payer: Self-pay | Admitting: Internal Medicine

## 2022-10-03 ENCOUNTER — Ambulatory Visit (AMBULATORY_SURGERY_CENTER): Payer: Federal, State, Local not specified - PPO | Admitting: Internal Medicine

## 2022-10-03 VITALS — BP 144/87 | HR 92 | Temp 98.4°F | Resp 19 | Ht 71.0 in | Wt 208.0 lb

## 2022-10-03 DIAGNOSIS — Z09 Encounter for follow-up examination after completed treatment for conditions other than malignant neoplasm: Secondary | ICD-10-CM

## 2022-10-03 DIAGNOSIS — Z860101 Personal history of adenomatous and serrated colon polyps: Secondary | ICD-10-CM

## 2022-10-03 DIAGNOSIS — Z1211 Encounter for screening for malignant neoplasm of colon: Secondary | ICD-10-CM | POA: Diagnosis not present

## 2022-10-03 DIAGNOSIS — Z86010 Personal history of colonic polyps: Secondary | ICD-10-CM | POA: Diagnosis not present

## 2022-10-03 MED ORDER — SODIUM CHLORIDE 0.9 % IV SOLN: 500.0000 mL | INTRAVENOUS | Status: DC

## 2022-10-03 NOTE — Progress Notes (Signed)
Report to PACU, RN, vss, BBS= Clear.  

## 2022-10-03 NOTE — Op Note (Signed)
[facility] Patient Name: [name] Procedure Date: 10/03/2022 11:33 AM MRN: [id number] Endoscopist: Iva Boop , MD Age: 71 Referring MD:  Date of Birth: 1951-05-02 Gender: Male Account #: [id number] Procedure:                Colonoscopy Indications:              Surveillance: Personal history of adenomatous                            polyps on last colonoscopy > 5 years ago, Last                            colonoscopy: 2016 Medicines:                Monitored Anesthesia Care Procedure:                Pre-Anesthesia Assessment:                           - Prior to the procedure, a History and Physical                            was performed, and patient medications and                            allergies were reviewed. The patient's tolerance of                            previous anesthesia was also reviewed. The risks                            and benefits of the procedure and the sedation                            options and risks were discussed with the patient.                            All questions were answered, and informed consent                            was obtained. Prior Anticoagulants: The patient has                            taken no previous anticoagulant or antiplatelet                            agents. ASA Grade Assessment: II - A patient with                            mild systemic disease. After reviewing the risks                            and benefits, the patient was deemed in  satisfactory condition to undergo the procedure.                           After obtaining informed consent, the colonoscope                            was passed under direct vision. Throughout the                            procedure, the patient's blood pressure, pulse, and                            oxygen saturations were monitored continuously. The                            Olympus CF-HQ190L [id number]) Colonoscope was                             introduced through the anus and advanced to the the                            cecum, identified by appendiceal orifice and                            ileocecal valve. The colonoscopy was somewhat                            difficult due to significant looping. Successful                            completion of the procedure was aided by applying                            abdominal pressure. The patient tolerated the                            procedure well. The quality of the bowel                            preparation was good. The ileocecal valve,                            appendiceal orifice, and rectum were photographed.                            The bowel preparation used was Miralax via split                            dose instruction. Scope In: 11:36:29 AM Scope Out: 68:34:19 AM Scope Withdrawal Time: 0 hours 13 minutes 2 seconds  Total Procedure Duration: 0 hours 20 minutes 19 seconds  Findings:                 The perianal and digital rectal examinations were  normal.                           The entire examined colon appeared normal on direct                            and retroflexion views. Complications:            No immediate complications. Estimated Blood Loss:     Estimated blood loss: none. Estimated blood loss:                            none. Impression:               - The entire examined colon is normal on direct and                            retroflexion views.                           - No specimens collected.                           - Personal history of colonic polyps. (2003 - 9 mm                            adenoma and possible other adenomas (max 3) with 4                            diminutive polyps                           destroyed 2005 no polyps 2010 4 mm distal                            hyperplastic polyp) 2016 no polyps Recommendation:           - Patient has a contact number available for                             emergencies. The signs and symptoms of potential                            delayed complications were discussed with the                            patient. Return to normal activities tomorrow.                            Written discharge instructions were provided to the                            patient.                           - Resume previous diet.                           -  Continue present medications.                           - No repeat colonoscopy due to age and the absence                            of colonic polyps. Iva Boop, MD 10/03/2022 12:07:12 PM This report has been signed electronically.

## 2022-10-03 NOTE — Patient Instructions (Addendum)
I did not find any polyps today - the colonoscopy was normal.  I do not recommend any more routine repeat colonoscopy testing for you given history, age and findings today.  I appreciate the opportunity to care for you. Gatha Mayer, MD, [facility]  There were no polyps seen today!  You will not need another screening colonoscopy! However, please call us at [phone] if you have a change in bowel habits, change in family history of colo-rectal cancer, rectal bleeding or other GI concerns in the future.    YOU HAD AN ENDOSCOPIC PROCEDURE TODAY AT [facility]:   Refer to the procedure report that was given to you for any specific questions about what was found during the examination.  If the procedure report does not answer your questions, please call your gastroenterologist to clarify.  If you requested that your care partner not be given the details of your procedure findings, then the procedure report has been included in a sealed envelope for you to review at your convenience later.  YOU SHOULD EXPECT: Some feelings of bloating in the abdomen. Passage of more gas than usual.  Walking can help get rid of the air that was put into your GI tract during the procedure and reduce the bloating. If you had a lower endoscopy (such as a colonoscopy or flexible sigmoidoscopy) you may notice spotting of blood in your stool or on the toilet paper. If you underwent a bowel prep for your procedure, you may not have a normal bowel movement for a few days.  Please Note:  You might notice some irritation and congestion in your nose or some drainage.  This is from the oxygen used during your procedure.  There is no need for concern and it should clear up in a day or so.  SYMPTOMS TO REPORT IMMEDIATELY:  Following lower endoscopy (colonoscopy):  Excessive amounts of blood in the stool  Significant tenderness or worsening of abdominal pains  Swelling of the abdomen that is new, acute  Fever of  100F or higher  For urgent or emergent issues, a gastroenterologist can be reached at any hour by calling [phone]. Do not use MyChart messaging for urgent concerns.    DIET:  We do recommend a small meal at first, but then you may proceed to your regular diet.  Drink plenty of fluids but you should avoid alcoholic beverages for 24 hours.  ACTIVITY:  You should plan to take it easy for the rest of today and you should NOT DRIVE or use heavy machinery until tomorrow (because of the sedation medicines used during the test).    FOLLOW UP: Our staff will call the number listed on your records the next business day following your procedure.  We will call around 7:15- 8:00 am to check on you and address any questions or concerns that you may have regarding the information given to you following your procedure. If we do not reach you, we will leave a message.     If any biopsies were taken you will be contacted by phone or by letter within the next 1-3 weeks.  Please call us at [phone] if you have not heard about the biopsies in 3 weeks.    SIGNATURES/CONFIDENTIALITY: You and/or your care partner have signed paperwork which will be entered into your electronic medical record.  These signatures attest to the fact that that the information above on your After Visit Summary has been reviewed and is understood.  Full responsibility of the confidentiality of this discharge information lies with you and/or your care-partner.  

## 2022-10-04 ENCOUNTER — Telehealth: Payer: Self-pay

## 2022-10-04 NOTE — Telephone Encounter (Signed)
  Follow up Call-     10/03/2022   11:01 AM  Call back number  Post procedure Call Back phone  # [phone]  Permission to leave phone message Yes     Patient questions:  Do you have a fever, pain , or abdominal swelling? No. Pain Score  0 *  Have you tolerated food without any problems? Yes.    Have you been able to return to your normal activities? Yes.    Do you have any questions about your discharge instructions: Diet   No. Medications  No. Follow up visit  No.  Do you have questions or concerns about your Care? No.  Actions: * If pain score is 4 or above: No action needed, pain <4.

## 2022-12-30 ENCOUNTER — Other Ambulatory Visit (INDEPENDENT_AMBULATORY_CARE_PROVIDER_SITE_OTHER): Payer: Federal, State, Local not specified - PPO

## 2022-12-30 ENCOUNTER — Encounter: Payer: Self-pay | Admitting: Internal Medicine

## 2022-12-30 ENCOUNTER — Ambulatory Visit: Payer: Federal, State, Local not specified - PPO | Admitting: Internal Medicine

## 2022-12-30 VITALS — BP 138/62 | HR 116 | Temp 99.9°F | Ht 71.0 in | Wt 208.0 lb

## 2022-12-30 DIAGNOSIS — I2510 Atherosclerotic heart disease of native coronary artery without angina pectoris: Secondary | ICD-10-CM

## 2022-12-30 DIAGNOSIS — I2583 Coronary atherosclerosis due to lipid rich plaque: Secondary | ICD-10-CM | POA: Diagnosis not present

## 2022-12-30 DIAGNOSIS — M25512 Pain in left shoulder: Secondary | ICD-10-CM | POA: Diagnosis not present

## 2022-12-30 DIAGNOSIS — E785 Hyperlipidemia, unspecified: Secondary | ICD-10-CM | POA: Diagnosis not present

## 2022-12-30 DIAGNOSIS — E119 Type 2 diabetes mellitus without complications: Secondary | ICD-10-CM | POA: Diagnosis not present

## 2022-12-30 DIAGNOSIS — R509 Fever, unspecified: Secondary | ICD-10-CM | POA: Diagnosis not present

## 2022-12-30 DIAGNOSIS — G8929 Other chronic pain: Secondary | ICD-10-CM

## 2022-12-30 LAB — CBC WITH DIFFERENTIAL/PLATELET
Basophils Absolute: 0.1 10*3/uL (ref 0.0–0.1)
Basophils Relative: 0.8 % (ref 0.0–3.0)
Eosinophils Absolute: 0.2 10*3/uL (ref 0.0–0.7)
Eosinophils Relative: 2.3 % (ref 0.0–5.0)
HCT: 50.4 % (ref 39.0–52.0)
Hemoglobin: 16.8 g/dL (ref 13.0–17.0)
Lymphocytes Relative: 33 % (ref 12.0–46.0)
Lymphs Abs: 2.3 10*3/uL (ref 0.7–4.0)
MCHC: 33.4 g/dL (ref 30.0–36.0)
MCV: 95.7 fl (ref 78.0–100.0)
Monocytes Absolute: 0.6 10*3/uL (ref 0.1–1.0)
Monocytes Relative: 8.5 % (ref 3.0–12.0)
Neutro Abs: 3.9 10*3/uL (ref 1.4–7.7)
Neutrophils Relative %: 55.4 % (ref 43.0–77.0)
Platelets: 206 10*3/uL (ref 150.0–400.0)
RBC: 5.27 Mil/uL (ref 4.22–5.81)
RDW: 14.1 % (ref 11.5–15.5)
WBC: 7 10*3/uL (ref 4.0–10.5)

## 2022-12-30 LAB — HEMOGLOBIN A1C: Hgb A1c MFr Bld: 7.4 % — ABNORMAL HIGH (ref 4.6–6.5)

## 2022-12-30 LAB — POC COVID19 BINAXNOW: SARS Coronavirus 2 Ag: NEGATIVE

## 2022-12-30 NOTE — Patient Instructions (Signed)
Blue-Emu cream -- use 2-3 times a day ? ?

## 2022-12-30 NOTE — Assessment & Plan Note (Signed)
  On Metformin, Prandin, Actos Check A1c

## 2022-12-30 NOTE — Assessment & Plan Note (Signed)
Cont on aspirin and fish oil

## 2022-12-30 NOTE — Progress Notes (Signed)
Subjective:  Patient ID: [name], male    DOB: [birth year]  Age: 72 y.o. MRN: [id number]  CC: No chief complaint on file.   HPI [name] presents for DM, HTN, CAD C/o L shoulder pain - partial tear on the MRI ([name])  Outpatient Medications Prior to Visit  Medication Sig Dispense Refill   aspirin 81 MG EC tablet Take 81 mg by mouth daily.     Cholecalciferol (VITAMIN D) 2000 units CAPS Take 2,000 Units by mouth daily.     metFORMIN (GLUCOPHAGE) 1000 MG tablet Take 1 tablet (1,000 mg total) by mouth 2 (two) times daily with a meal. 180 tablet 3   Omega-3 Fatty Acids (FISH OIL) 1000 MG CAPS Take 1 capsule by mouth 2 (two) times daily.     pioglitazone (ACTOS) 15 MG tablet Take 1 tablet (15 mg total) by mouth daily. 90 tablet 3   repaglinide (PRANDIN) 2 MG tablet Take 1 tablet (2 mg total) by mouth 3 (three) times daily before meals. 270 tablet 3   cyclobenzaprine (FLEXERIL) 5 MG tablet Take 1-2 tablets (5-10 mg total) by mouth 3 times/day as needed-between meals & bedtime for muscle spasms. (Patient not taking: Reported on 10/03/2022) 60 tablet 3   No facility-administered medications prior to visit.    ROS: Review of Systems  Constitutional:  Negative for appetite change, fatigue and unexpected weight change.  HENT:  Negative for congestion, nosebleeds, sneezing, sore throat and trouble swallowing.   Eyes:  Negative for itching and visual disturbance.  Respiratory:  Negative for cough.   Cardiovascular:  Negative for chest pain, palpitations and leg swelling.  Gastrointestinal:  Negative for abdominal distention, blood in stool, diarrhea and nausea.  Genitourinary:  Negative for frequency and hematuria.  Musculoskeletal:  Positive for arthralgias. Negative for back pain, gait problem, joint swelling and neck pain.  Skin:  Negative for rash.  Neurological:  Negative for dizziness, tremors, speech difficulty and weakness.  Psychiatric/Behavioral:  Negative for agitation,  dysphoric mood and sleep disturbance. The patient is not nervous/anxious.     Objective:  BP 138/62 (BP Location: Right Arm, Patient Position: Sitting, Cuff Size: Normal)   Pulse (!) 116   Temp 99.9 F (37.7 C) (Oral)   Ht 5\' 11"  (1.803 m)   Wt 208 lb (94.3 kg)   SpO2 92%   BMI 29.01 kg/m   BP Readings from Last 3 Encounters:  12/30/22 138/62  10/03/22 (!) 144/87  08/08/22 120/78    Wt Readings from Last 3 Encounters:  12/30/22 208 lb (94.3 kg)  10/03/22 208 lb (94.3 kg)  08/08/22 208 lb (94.3 kg)    Physical Exam Constitutional:      General: He is not in acute distress.    Appearance: Normal appearance. He is well-developed.     Comments: NAD  Eyes:     Conjunctiva/sclera: Conjunctivae normal.     Pupils: Pupils are equal, round, and reactive to light.  Neck:     Thyroid: No thyromegaly.     Vascular: No JVD.  Cardiovascular:     Rate and Rhythm: Normal rate and regular rhythm.     Heart sounds: Normal heart sounds. No murmur heard.    No friction rub. No gallop.  Pulmonary:     Effort: Pulmonary effort is normal. No respiratory distress.     Breath sounds: Normal breath sounds. No wheezing or rales.  Chest:     Chest wall: No tenderness.  Abdominal:     General:  Bowel sounds are normal. There is no distension.     Palpations: Abdomen is soft. There is no mass.     Tenderness: There is no abdominal tenderness. There is no guarding or rebound.  Musculoskeletal:        General: No tenderness. Normal range of motion.     Cervical back: Normal range of motion.  Lymphadenopathy:     Cervical: No cervical adenopathy.  Skin:    General: Skin is warm and dry.     Findings: No rash.  Neurological:     Mental Status: He is alert and oriented to person, place, and time.     Cranial Nerves: No cranial nerve deficit.     Motor: No abnormal muscle tone.     Coordination: Coordination normal.     Gait: Gait normal.     Deep Tendon Reflexes: Reflexes are normal and  symmetric.  Psychiatric:        Behavior: Behavior normal.        Thought Content: Thought content normal.        Judgment: Judgment normal.     Lab Results  Component Value Date   WBC 7.0 12/30/2022   HGB 16.8 12/30/2022   HCT 50.4 12/30/2022   PLT 206.0 12/30/2022   GLUCOSE 100 (H) 12/30/2022   CHOL 196 12/30/2022   TRIG 109.0 12/30/2022   HDL 61.90 12/30/2022   LDLDIRECT 116.0 01/06/2020   LDLCALC 112 (H) 12/30/2022   ALT 16 12/30/2022   AST 14 12/30/2022   NA 142 12/30/2022   K 4.1 12/30/2022   CL 105 12/30/2022   CREATININE 1.10 12/30/2022   BUN 14 12/30/2022   CO2 24 12/30/2022   TSH 1.19 12/30/2022   PSA 1.06 12/30/2022   HGBA1C 7.4 (H) 12/30/2022   MICROALBUR 7.4 (H) 01/06/2020    No results found.  Assessment & Plan:   Problem List Items Addressed This Visit       Cardiovascular and Mediastinum   Coronary atherosclerosis    Cont on aspirin and fish oil      Relevant Orders   TSH (Completed)   Urinalysis   CBC with Differential/Platelet (Completed)   Lipid panel (Completed)   PSA (Completed)   Comprehensive metabolic panel (Completed)   Hemoglobin A1c (Completed)     Endocrine   DM2 (diabetes mellitus, type 2) (HCC) - Primary     On Metformin, Prandin, Actos Check A1c      Relevant Orders   TSH (Completed)   Urinalysis   CBC with Differential/Platelet (Completed)   Lipid panel (Completed)   PSA (Completed)   Comprehensive metabolic panel (Completed)   Hemoglobin A1c (Completed)     Other   Shoulder pain, left    Dr Mardelle Matte, pt had an MRI- partial tear on the MRI ([name]) Blue-Emu cream was recommended to use 2-3 times a day       Dyslipidemia    On Omega-3      Relevant Orders   TSH (Completed)   Urinalysis   CBC with Differential/Platelet (Completed)   Lipid panel (Completed)   PSA (Completed)   Comprehensive metabolic panel (Completed)   Hemoglobin A1c (Completed)   Other Visit Diagnoses     Low grade fever        Relevant Orders   POC COVID-19 BinaxNow (Completed)         No orders of the defined types were placed in this encounter.     Follow-up: Return in about 6 months (  around 06/30/2023) for Wellness Exam.  Walker Kehr, MD

## 2022-12-30 NOTE — Assessment & Plan Note (Signed)
On Omega-3

## 2022-12-30 NOTE — Assessment & Plan Note (Signed)
Dr Mardelle Matte, pt had an MRI- partial tear on the MRI ([facility]) Blue-Emu cream was recommended to use 2-3 times a day

## 2022-12-31 LAB — COMPREHENSIVE METABOLIC PANEL
ALT: 16 U/L (ref 0–53)
AST: 14 U/L (ref 0–37)
Albumin: 4.6 g/dL (ref 3.5–5.2)
Alkaline Phosphatase: 45 U/L (ref 39–117)
BUN: 14 mg/dL (ref 6–23)
CO2: 24 mEq/L (ref 19–32)
Calcium: 10 mg/dL (ref 8.4–10.5)
Chloride: 105 mEq/L (ref 96–112)
Creatinine, Ser: 1.1 mg/dL (ref 0.40–1.50)
GFR: 67.76 mL/min (ref >60.00)
Glucose, Bld: 100 mg/dL — ABNORMAL HIGH (ref 70–99)
Potassium: 4.1 mEq/L (ref 3.5–5.1)
Sodium: 142 mEq/L (ref 135–145)
Total Bilirubin: 0.4 mg/dL (ref 0.2–1.2)
Total Protein: 7.6 g/dL (ref 6.0–8.3)

## 2022-12-31 LAB — URINALYSIS, ROUTINE W REFLEX MICROSCOPIC
Bilirubin Urine: NEGATIVE
Ketones, ur: NEGATIVE
Nitrite: NEGATIVE
Specific Gravity, Urine: >=1.03 — AB (ref 1.000–1.030)
Urine Glucose: 250 — AB
Urobilinogen, UA: 0.2 (ref 0.0–1.0)
pH: 5.5 (ref 5.0–8.0)

## 2022-12-31 LAB — LIPID PANEL
Cholesterol: 196 mg/dL (ref 0–200)
HDL: 61.9 mg/dL (ref >39.00)
LDL Cholesterol: 112 mg/dL — ABNORMAL HIGH (ref 0–99)
NonHDL: 133.95
Total CHOL/HDL Ratio: 3
Triglycerides: 109 mg/dL (ref 0.0–149.0)
VLDL: 21.8 mg/dL (ref 0.0–40.0)

## 2022-12-31 LAB — TSH: TSH: 1.19 u[IU]/mL (ref 0.35–5.50)

## 2022-12-31 LAB — PSA: PSA: 1.06 ng/mL (ref 0.10–4.00)

## 2023-06-30 ENCOUNTER — Ambulatory Visit: Payer: Federal, State, Local not specified - PPO | Admitting: Internal Medicine

## 2023-07-17 ENCOUNTER — Ambulatory Visit: Payer: Federal, State, Local not specified - PPO | Admitting: Internal Medicine

## 2023-07-17 ENCOUNTER — Encounter: Payer: Self-pay | Admitting: Internal Medicine

## 2023-07-17 VITALS — BP 130/80 | HR 107 | Temp 97.9°F | Ht 71.0 in | Wt 211.0 lb

## 2023-07-17 DIAGNOSIS — R350 Frequency of micturition: Secondary | ICD-10-CM | POA: Diagnosis not present

## 2023-07-17 DIAGNOSIS — I2583 Coronary atherosclerosis due to lipid rich plaque: Secondary | ICD-10-CM

## 2023-07-17 DIAGNOSIS — E119 Type 2 diabetes mellitus without complications: Secondary | ICD-10-CM | POA: Diagnosis not present

## 2023-07-17 DIAGNOSIS — I2510 Atherosclerotic heart disease of native coronary artery without angina pectoris: Secondary | ICD-10-CM

## 2023-07-17 DIAGNOSIS — L219 Seborrheic dermatitis, unspecified: Secondary | ICD-10-CM | POA: Insufficient documentation

## 2023-07-17 DIAGNOSIS — N401 Enlarged prostate with lower urinary tract symptoms: Secondary | ICD-10-CM | POA: Diagnosis not present

## 2023-07-17 DIAGNOSIS — R030 Elevated blood-pressure reading, without diagnosis of hypertension: Secondary | ICD-10-CM

## 2023-07-17 DIAGNOSIS — Z7984 Long term (current) use of oral hypoglycemic drugs: Secondary | ICD-10-CM

## 2023-07-17 DIAGNOSIS — E785 Hyperlipidemia, unspecified: Secondary | ICD-10-CM

## 2023-07-17 LAB — URINALYSIS, ROUTINE W REFLEX MICROSCOPIC
Bilirubin Urine: NEGATIVE
Ketones, ur: NEGATIVE
Nitrite: NEGATIVE
Specific Gravity, Urine: >=1.03 — AB (ref 1.000–1.030)
Total Protein, Urine: NEGATIVE
Urine Glucose: 500 — AB
Urobilinogen, UA: 0.2 (ref 0.0–1.0)
pH: 6 (ref 5.0–8.0)

## 2023-07-17 LAB — COMPREHENSIVE METABOLIC PANEL
ALT: 17 U/L (ref 0–53)
AST: 15 U/L (ref 0–37)
Albumin: 4.7 g/dL (ref 3.5–5.2)
Alkaline Phosphatase: 48 U/L (ref 39–117)
BUN: 16 mg/dL (ref 6–23)
CO2: 27 mEq/L (ref 19–32)
Calcium: 10 mg/dL (ref 8.4–10.5)
Chloride: 100 mEq/L (ref 96–112)
Creatinine, Ser: 1.1 mg/dL (ref 0.40–1.50)
GFR: 67.5 mL/min (ref >60.00)
Glucose, Bld: 170 mg/dL — ABNORMAL HIGH (ref 70–99)
Potassium: 4 mEq/L (ref 3.5–5.1)
Sodium: 137 mEq/L (ref 135–145)
Total Bilirubin: 0.6 mg/dL (ref 0.2–1.2)
Total Protein: 7.7 g/dL (ref 6.0–8.3)

## 2023-07-17 LAB — CBC WITH DIFFERENTIAL/PLATELET
Basophils Absolute: 0 10*3/uL (ref 0.0–0.1)
Basophils Relative: 0.3 % (ref 0.0–3.0)
Eosinophils Absolute: 0.3 10*3/uL (ref 0.0–0.7)
Eosinophils Relative: 4.1 % (ref 0.0–5.0)
HCT: 45.6 % (ref 39.0–52.0)
Hemoglobin: 14.9 g/dL (ref 13.0–17.0)
Lymphocytes Relative: 30.3 % (ref 12.0–46.0)
Lymphs Abs: 2.1 10*3/uL (ref 0.7–4.0)
MCHC: 32.6 g/dL (ref 30.0–36.0)
MCV: 95.7 fl (ref 78.0–100.0)
Monocytes Absolute: 0.6 10*3/uL (ref 0.1–1.0)
Monocytes Relative: 8.9 % (ref 3.0–12.0)
Neutro Abs: 3.9 10*3/uL (ref 1.4–7.7)
Neutrophils Relative %: 56.4 % (ref 43.0–77.0)
Platelets: 222 10*3/uL (ref 150.0–400.0)
RBC: 4.76 Mil/uL (ref 4.22–5.81)
RDW: 14 % (ref 11.5–15.5)
WBC: 6.9 10*3/uL (ref 4.0–10.5)

## 2023-07-17 LAB — PSA: PSA: 1.41 ng/mL (ref 0.10–4.00)

## 2023-07-17 LAB — TSH: TSH: 0.58 u[IU]/mL (ref 0.35–5.50)

## 2023-07-17 LAB — HEMOGLOBIN A1C: Hgb A1c MFr Bld: 6.8 % — ABNORMAL HIGH (ref 4.6–6.5)

## 2023-07-17 MED ORDER — CLOTRIMAZOLE-BETAMETHASONE 1-0.05 % EX CREA: 1.0000 | TOPICAL_CREAM | Freq: Two times a day (BID) | CUTANEOUS | 1 refills | Status: DC | PRN

## 2023-07-17 NOTE — Progress Notes (Signed)
DM  Subjective:  Patient ID: [name], male    DOB: 1951-07-09  Age: 72 y.o. MRN: [id number]  CC: Follow-up (6 mnth f/u)   HPI [name] presents for DM, UTIs, CAD C/o rash Outpatient Medications Prior to Visit  Medication Sig Dispense Refill   aspirin 81 MG EC tablet Take 81 mg by mouth daily.     Cholecalciferol (VITAMIN D) 2000 units CAPS Take 2,000 Units by mouth daily.     metFORMIN (GLUCOPHAGE) 1000 MG tablet Take 1 tablet (1,000 mg total) by mouth 2 (two) times daily with a meal. 180 tablet 3   Omega-3 Fatty Acids (FISH OIL) 1000 MG CAPS Take 1 capsule by mouth 2 (two) times daily.     pioglitazone (ACTOS) 15 MG tablet Take 1 tablet (15 mg total) by mouth daily. 90 tablet 3   repaglinide (PRANDIN) 2 MG tablet Take 1 tablet (2 mg total) by mouth 3 (three) times daily before meals. 270 tablet 3   cyclobenzaprine (FLEXERIL) 5 MG tablet Take 1-2 tablets (5-10 mg total) by mouth 3 times/day as needed-between meals & bedtime for muscle spasms. (Patient not taking: Reported on 10/03/2022) 60 tablet 3   No facility-administered medications prior to visit.    ROS: Review of Systems  Constitutional:  Negative for appetite change, fatigue and unexpected weight change.  HENT:  Negative for congestion, nosebleeds, sneezing, sore throat and trouble swallowing.   Eyes:  Negative for itching and visual disturbance.  Respiratory:  Negative for cough.   Cardiovascular:  Negative for chest pain, palpitations and leg swelling.  Gastrointestinal:  Negative for abdominal distention, blood in stool, diarrhea and nausea.  Genitourinary:  Negative for frequency and hematuria.  Musculoskeletal:  Negative for back pain, gait problem, joint swelling and neck pain.  Skin:  Negative for rash.  Neurological:  Negative for dizziness, tremors, speech difficulty and weakness.  Psychiatric/Behavioral:  Negative for agitation, dysphoric mood, sleep disturbance and suicidal ideas. The patient is not  nervous/anxious.     Objective:  BP 130/80 (BP Location: Left Arm, Patient Position: Sitting, Cuff Size: Large)   Pulse (!) 107   Temp 97.9 F (36.6 C) (Oral)   Ht 5\' 11"  (1.803 m)   Wt 211 lb (95.7 kg)   SpO2 97%   BMI 29.43 kg/m   BP Readings from Last 3 Encounters:  07/17/23 130/80  12/30/22 138/62  10/03/22 (!) 144/87    Wt Readings from Last 3 Encounters:  07/17/23 211 lb (95.7 kg)  12/30/22 208 lb (94.3 kg)  10/03/22 208 lb (94.3 kg)    Physical Exam Constitutional:      General: He is not in acute distress.    Appearance: Normal appearance. He is well-developed.     Comments: NAD  Eyes:     Conjunctiva/sclera: Conjunctivae normal.     Pupils: Pupils are equal, round, and reactive to light.  Neck:     Thyroid: No thyromegaly.     Vascular: No JVD.  Cardiovascular:     Rate and Rhythm: Normal rate and regular rhythm.     Heart sounds: Normal heart sounds. No murmur heard.    No friction rub. No gallop.  Pulmonary:     Effort: Pulmonary effort is normal. No respiratory distress.     Breath sounds: Normal breath sounds. No wheezing or rales.  Chest:     Chest wall: No tenderness.  Abdominal:     General: Bowel sounds are normal. There is no distension.  Palpations: Abdomen is soft. There is no mass.     Tenderness: There is no abdominal tenderness. There is no guarding or rebound.  Musculoskeletal:        General: No tenderness. Normal range of motion.     Cervical back: Normal range of motion.  Lymphadenopathy:     Cervical: No cervical adenopathy.  Skin:    General: Skin is warm and dry.     Findings: No rash.  Neurological:     Mental Status: He is alert and oriented to person, place, and time.     Cranial Nerves: No cranial nerve deficit.     Motor: No abnormal muscle tone.     Coordination: Coordination normal.     Gait: Gait normal.     Deep Tendon Reflexes: Reflexes are normal and symmetric.  Psychiatric:        Behavior: Behavior normal.         Thought Content: Thought content normal.        Judgment: Judgment normal.   SD - face  Lab Results  Component Value Date   WBC 7.0 12/30/2022   HGB 16.8 12/30/2022   HCT 50.4 12/30/2022   PLT 206.0 12/30/2022   GLUCOSE 100 (H) 12/30/2022   CHOL 196 12/30/2022   TRIG 109.0 12/30/2022   HDL 61.90 12/30/2022   LDLDIRECT 116.0 01/06/2020   LDLCALC 112 (H) 12/30/2022   ALT 16 12/30/2022   AST 14 12/30/2022   NA 142 12/30/2022   K 4.1 12/30/2022   CL 105 12/30/2022   CREATININE 1.10 12/30/2022   BUN 14 12/30/2022   CO2 24 12/30/2022   TSH 1.19 12/30/2022   PSA 1.06 12/30/2022   HGBA1C 7.4 (H) 12/30/2022   MICROALBUR 7.4 (H) 01/06/2020    No results found.  Assessment & Plan:   Problem List Items Addressed This Visit     DM2 (diabetes mellitus, type 2) (HCC) - Primary    On Metformin, Prandin Actos 15 mg daily added 7/21  cardiac CT scan for calcium scoring was offered 1/20, 1/21  1/22 CT coronary calcium score is 2.75  On Metformin, Prandin, Actos      Relevant Orders   CBC with Differential/Platelet   Comprehensive metabolic panel   PSA   Hemoglobin A1c   Urinalysis   TSH   BPH (benign prostatic hyperplasia)    F/u w/Dr Retta Diones - PSA q 12 mo      Relevant Orders   CBC with Differential/Platelet   Comprehensive metabolic panel   PSA   Hemoglobin A1c   Urinalysis   TSH   Elevated BP without diagnosis of hypertension    BP Readings from Last 3 Encounters:  07/17/23 130/80  12/30/22 138/62  10/03/22 (!) 144/87         Dyslipidemia    On Omega-3      Relevant Orders   TSH   Coronary atherosclerosis    Cont on aspirin and fish oil      Seborrheic dermatitis    Lotrisone prn         Meds ordered this encounter  Medications   clotrimazole-betamethasone (LOTRISONE) cream    Sig: Apply 1 Application topically 2 (two) times daily as needed.    Dispense:  45 g    Refill:  1      Follow-up: Return in about 6 months (around  01/17/2024) for Wellness Exam.  Sonda Primes, MD

## 2023-07-17 NOTE — Assessment & Plan Note (Signed)
On Omega-3

## 2023-07-17 NOTE — Assessment & Plan Note (Signed)
BP Readings from Last 3 Encounters:  07/17/23 130/80  12/30/22 138/62  10/03/22 (!) 144/87

## 2023-07-17 NOTE — Assessment & Plan Note (Signed)
On Metformin, Prandin Actos 15 mg daily added 7/21  cardiac CT scan for calcium scoring was offered 1/20, 1/21  1/22 CT coronary calcium score is 2.75  On Metformin, Prandin, Actos

## 2023-07-17 NOTE — Assessment & Plan Note (Signed)
Cont on aspirin and fish oil 

## 2023-07-17 NOTE — Assessment & Plan Note (Signed)
F/u w/Dr Dahlstedt - PSA q 12 mo

## 2023-07-17 NOTE — Assessment & Plan Note (Signed)
Lotrisone prn 

## 2023-09-18 ENCOUNTER — Telehealth: Payer: Self-pay | Admitting: Internal Medicine

## 2023-09-18 NOTE — Telephone Encounter (Signed)
Prescription Request  09/18/2023  LOV: 07/17/2023  What is the name of the medication or equipment? metFORMIN (GLUCOPHAGE) 1000 MG tablet  pioglitazone (ACTOS) 15 MG tablet  repaglinide (PRANDIN) 2 MG tablet  Have you contacted your pharmacy to request a refill? No   Which pharmacy would you like this sent to?  [facility] #[id number] [place], [place] - [address] AT [facility] OF [facility] [address] [place] [address] Phone: [phone] Fax: [phone]    Patient notified that their request is being sent to the clinical staff for review and that they should receive a response within 2 business days.   Please advise at Mobile [phone] (mobile)

## 2023-09-19 ENCOUNTER — Other Ambulatory Visit: Payer: Self-pay | Admitting: Internal Medicine

## 2023-09-19 ENCOUNTER — Other Ambulatory Visit: Payer: Self-pay

## 2023-09-19 MED ORDER — METFORMIN HCL 1000 MG PO TABS: 1000.0000 mg | ORAL_TABLET | Freq: Two times a day (BID) | ORAL | 3 refills | Status: DC

## 2023-09-19 MED ORDER — REPAGLINIDE 2 MG PO TABS: 2.0000 mg | ORAL_TABLET | Freq: Three times a day (TID) | ORAL | 3 refills | Status: DC

## 2023-09-19 MED ORDER — PIOGLITAZONE HCL 15 MG PO TABS: 15.0000 mg | ORAL_TABLET | Freq: Every day | ORAL | 3 refills | Status: DC

## 2023-09-19 NOTE — Telephone Encounter (Signed)
Rx has been sent into pt's pharmacy.  

## 2023-10-13 DIAGNOSIS — N400 Enlarged prostate without lower urinary tract symptoms: Secondary | ICD-10-CM | POA: Diagnosis not present

## 2023-11-28 ENCOUNTER — Telehealth: Payer: Self-pay | Admitting: Internal Medicine

## 2023-11-28 NOTE — Telephone Encounter (Signed)
Prescription Request  11/28/2023  LOV: 07/17/2023  What is the name of the medication or equipment? Triamcinolone acetonide cream  Have you contacted your pharmacy to request a refill? No   Which pharmacy would you like this sent to?  [facility] #[id number] [place], [place] - [address] AT [facility] OF [facility] [address] [place] [address] Phone: [phone] Fax: [phone]    Patient notified that their request is being sent to the clinical staff for review and that they should receive a response within 2 business days.   Please advise at Mobile [phone] (mobile)

## 2023-12-03 MED ORDER — TRIAMCINOLONE ACETONIDE 0.1 % EX OINT: 1.0000 | TOPICAL_OINTMENT | Freq: Three times a day (TID) | CUTANEOUS | 2 refills | Status: AC

## 2023-12-03 NOTE — Telephone Encounter (Signed)
Done. Thank you.

## 2024-01-19 ENCOUNTER — Encounter: Payer: Self-pay | Admitting: Internal Medicine

## 2024-01-19 ENCOUNTER — Ambulatory Visit (INDEPENDENT_AMBULATORY_CARE_PROVIDER_SITE_OTHER): Payer: Federal, State, Local not specified - PPO | Admitting: Internal Medicine

## 2024-01-19 VITALS — BP 122/80 | HR 94 | Temp 98.3°F | Ht 71.0 in | Wt 213.0 lb

## 2024-01-19 DIAGNOSIS — R350 Frequency of micturition: Secondary | ICD-10-CM

## 2024-01-19 DIAGNOSIS — Z7984 Long term (current) use of oral hypoglycemic drugs: Secondary | ICD-10-CM

## 2024-01-19 DIAGNOSIS — N390 Urinary tract infection, site not specified: Secondary | ICD-10-CM

## 2024-01-19 DIAGNOSIS — N401 Enlarged prostate with lower urinary tract symptoms: Secondary | ICD-10-CM

## 2024-01-19 DIAGNOSIS — I2583 Coronary atherosclerosis due to lipid rich plaque: Secondary | ICD-10-CM

## 2024-01-19 DIAGNOSIS — Z0000 Encounter for general adult medical examination without abnormal findings: Secondary | ICD-10-CM | POA: Diagnosis not present

## 2024-01-19 DIAGNOSIS — E785 Hyperlipidemia, unspecified: Secondary | ICD-10-CM

## 2024-01-19 DIAGNOSIS — R030 Elevated blood-pressure reading, without diagnosis of hypertension: Secondary | ICD-10-CM

## 2024-01-19 DIAGNOSIS — E119 Type 2 diabetes mellitus without complications: Secondary | ICD-10-CM

## 2024-01-19 DIAGNOSIS — L219 Seborrheic dermatitis, unspecified: Secondary | ICD-10-CM

## 2024-01-19 LAB — CBC WITH DIFFERENTIAL/PLATELET
Basophils Absolute: 0 10*3/uL (ref 0.0–0.1)
Basophils Relative: 0.7 % (ref 0.0–3.0)
Eosinophils Absolute: 0.2 10*3/uL (ref 0.0–0.7)
Eosinophils Relative: 3.6 % (ref 0.0–5.0)
HCT: 47.9 % (ref 39.0–52.0)
Hemoglobin: 15.9 g/dL (ref 13.0–17.0)
Lymphocytes Relative: 34.6 % (ref 12.0–46.0)
Lymphs Abs: 2.4 10*3/uL (ref 0.7–4.0)
MCHC: 33.3 g/dL (ref 30.0–36.0)
MCV: 94.5 fL (ref 78.0–100.0)
Monocytes Absolute: 0.7 10*3/uL (ref 0.1–1.0)
Monocytes Relative: 9.9 % (ref 3.0–12.0)
Neutro Abs: 3.5 10*3/uL (ref 1.4–7.7)
Neutrophils Relative %: 51.2 % (ref 43.0–77.0)
Platelets: 208 10*3/uL (ref 150.0–400.0)
RBC: 5.06 Mil/uL (ref 4.22–5.81)
RDW: 14.2 % (ref 11.5–15.5)
WBC: 6.8 10*3/uL (ref 4.0–10.5)

## 2024-01-19 LAB — URINALYSIS, ROUTINE W REFLEX MICROSCOPIC
Bilirubin Urine: NEGATIVE
Ketones, ur: NEGATIVE
Nitrite: NEGATIVE
Specific Gravity, Urine: >=1.03 — AB (ref 1.000–1.030)
Urine Glucose: 250 — AB
Urobilinogen, UA: 0.2 (ref 0.0–1.0)
pH: 6 (ref 5.0–8.0)

## 2024-01-19 LAB — HEMOGLOBIN A1C: Hgb A1c MFr Bld: 7.2 % — ABNORMAL HIGH (ref 4.6–6.5)

## 2024-01-19 LAB — LIPID PANEL
Cholesterol: 192 mg/dL (ref 0–200)
HDL: 61.8 mg/dL (ref >39.00)
LDL Cholesterol: 116 mg/dL — ABNORMAL HIGH (ref 0–99)
NonHDL: 130.31
Total CHOL/HDL Ratio: 3
Triglycerides: 72 mg/dL (ref 0.0–149.0)
VLDL: 14.4 mg/dL (ref 0.0–40.0)

## 2024-01-19 LAB — MICROALBUMIN / CREATININE URINE RATIO
Creatinine,U: 159.3 mg/dL
Microalb Creat Ratio: 3.9 mg/g (ref 0.0–30.0)
Microalb, Ur: 6.1 mg/dL — ABNORMAL HIGH (ref 0.0–1.9)

## 2024-01-19 LAB — COMPREHENSIVE METABOLIC PANEL
ALT: 18 U/L (ref 0–53)
AST: 17 U/L (ref 0–37)
Albumin: 4.3 g/dL (ref 3.5–5.2)
Alkaline Phosphatase: 48 U/L (ref 39–117)
BUN: 12 mg/dL (ref 6–23)
CO2: 25 meq/L (ref 19–32)
Calcium: 9.1 mg/dL (ref 8.4–10.5)
Chloride: 104 meq/L (ref 96–112)
Creatinine, Ser: 1.01 mg/dL (ref 0.40–1.50)
GFR: 74.52 mL/min (ref >60.00)
Glucose, Bld: 122 mg/dL — ABNORMAL HIGH (ref 70–99)
Potassium: 3.8 meq/L (ref 3.5–5.1)
Sodium: 138 meq/L (ref 135–145)
Total Bilirubin: 0.6 mg/dL (ref 0.2–1.2)
Total Protein: 7.4 g/dL (ref 6.0–8.3)

## 2024-01-19 LAB — PSA: PSA: 0.95 ng/mL (ref 0.10–4.00)

## 2024-01-19 LAB — TSH: TSH: 1.22 u[IU]/mL (ref 0.35–5.50)

## 2024-01-19 MED ORDER — TACROLIMUS 0.1 % EX OINT: TOPICAL_OINTMENT | Freq: Two times a day (BID) | CUTANEOUS | 1 refills | Status: AC

## 2024-01-19 MED ORDER — CLOTRIMAZOLE-BETAMETHASONE 1-0.05 % EX LOTN: TOPICAL_LOTION | Freq: Two times a day (BID) | CUTANEOUS | 1 refills | Status: AC

## 2024-01-19 NOTE — Assessment & Plan Note (Signed)
Cont on aspirin and fish oil 

## 2024-01-19 NOTE — Assessment & Plan Note (Signed)
BP Readings from Last 3 Encounters:  01/19/24 122/80  07/17/23 130/80  12/30/22 138/62

## 2024-01-19 NOTE — Assessment & Plan Note (Signed)
We discussed age appropriate health related issues, including available/recomended screening tests and vaccinations. We discussed a need for adhering to healthy diet and exercise. Labs were ordered to be later reviewed . All questions were answered. CT ca score test offered 1/20 Dr Retta Diones - PSA q 12 mo Colon is due 2026 Rectal - per Urology/GI

## 2024-01-19 NOTE — Progress Notes (Signed)
Subjective:  Patient ID: [name], male    DOB: 1950/12/18  Age: 73 y.o. MRN: [id number]  CC: Annual Exam   HPI Bing Jelley presents for DM, dyslipidemia, UTI C/o SD Well exam  Outpatient Medications Prior to Visit  Medication Sig Dispense Refill   aspirin 81 MG EC tablet Take 81 mg by mouth daily.     Cholecalciferol (VITAMIN D) 2000 units CAPS Take 2,000 Units by mouth daily.     metFORMIN (GLUCOPHAGE) 1000 MG tablet Take 1 tablet (1,000 mg total) by mouth 2 (two) times daily with a meal. 180 tablet 3   Omega-3 Fatty Acids (FISH OIL) 1000 MG CAPS Take 1 capsule by mouth 2 (two) times daily.     pioglitazone (ACTOS) 15 MG tablet Take 1 tablet (15 mg total) by mouth daily. 90 tablet 3   repaglinide (PRANDIN) 2 MG tablet Take 1 tablet (2 mg total) by mouth 3 (three) times daily before meals. 270 tablet 3   triamcinolone ointment (KENALOG) 0.1 % Apply 1 Application topically 3 (three) times daily. 80 g 2   clotrimazole-betamethasone (LOTRISONE) cream Apply 1 Application topically 2 (two) times daily as needed. 45 g 1   cyclobenzaprine (FLEXERIL) 5 MG tablet Take 1-2 tablets (5-10 mg total) by mouth 3 times/day as needed-between meals & bedtime for muscle spasms. (Patient not taking: Reported on 01/19/2024) 60 tablet 3   No facility-administered medications prior to visit.    ROS: Review of Systems  Constitutional:  Negative for appetite change, fatigue and unexpected weight change.  HENT:  Negative for congestion, nosebleeds, sneezing, sore throat and trouble swallowing.   Eyes:  Negative for itching and visual disturbance.  Respiratory:  Negative for cough.   Cardiovascular:  Negative for chest pain, palpitations and leg swelling.  Gastrointestinal:  Negative for abdominal distention, blood in stool, diarrhea and nausea.  Genitourinary:  Negative for frequency and hematuria.  Musculoskeletal:  Negative for back pain, gait problem, joint swelling and neck pain.  Skin:   Negative for rash.  Neurological:  Negative for dizziness, tremors, speech difficulty and weakness.  Psychiatric/Behavioral:  Negative for agitation, dysphoric mood and sleep disturbance. The patient is not nervous/anxious.     Objective:  BP 122/80 (BP Location: Left Arm, Patient Position: Sitting, Cuff Size: Normal)   Pulse 94   Temp 98.3 F (36.8 C) (Oral)   Ht 5\' 11"  (1.803 m)   Wt 213 lb (96.6 kg)   SpO2 95%   BMI 29.71 kg/m   BP Readings from Last 3 Encounters:  01/19/24 122/80  07/17/23 130/80  12/30/22 138/62    Wt Readings from Last 3 Encounters:  01/19/24 213 lb (96.6 kg)  07/17/23 211 lb (95.7 kg)  12/30/22 208 lb (94.3 kg)    Physical Exam Constitutional:      General: He is not in acute distress.    Appearance: Normal appearance. He is well-developed.     Comments: NAD  Eyes:     Conjunctiva/sclera: Conjunctivae normal.     Pupils: Pupils are equal, round, and reactive to light.  Neck:     Thyroid: No thyromegaly.     Vascular: No JVD.  Cardiovascular:     Rate and Rhythm: Normal rate and regular rhythm.     Heart sounds: Normal heart sounds. No murmur heard.    No friction rub. No gallop.  Pulmonary:     Effort: Pulmonary effort is normal. No respiratory distress.     Breath sounds: Normal breath sounds.  No wheezing or rales.  Chest:     Chest wall: No tenderness.  Abdominal:     General: Bowel sounds are normal. There is no distension.     Palpations: Abdomen is soft. There is no mass.     Tenderness: There is no abdominal tenderness. There is no guarding or rebound.  Musculoskeletal:        General: No tenderness. Normal range of motion.     Cervical back: Normal range of motion.  Lymphadenopathy:     Cervical: No cervical adenopathy.  Skin:    General: Skin is warm and dry.     Findings: No rash.  Neurological:     Mental Status: He is alert and oriented to person, place, and time.     Cranial Nerves: No cranial nerve deficit.     Motor:  No abnormal muscle tone.     Coordination: Coordination normal.     Gait: Gait normal.     Deep Tendon Reflexes: Reflexes are normal and symmetric.  Psychiatric:        Behavior: Behavior normal.        Thought Content: Thought content normal.        Judgment: Judgment normal.     Lab Results  Component Value Date   WBC 6.9 07/17/2023   HGB 14.9 07/17/2023   HCT 45.6 07/17/2023   PLT 222.0 07/17/2023   GLUCOSE 170 (H) 07/17/2023   CHOL 196 12/30/2022   TRIG 109.0 12/30/2022   HDL 61.90 12/30/2022   LDLDIRECT 116.0 01/06/2020   LDLCALC 112 (H) 12/30/2022   ALT 17 07/17/2023   AST 15 07/17/2023   NA 137 07/17/2023   K 4.0 07/17/2023   CL 100 07/17/2023   CREATININE 1.10 07/17/2023   BUN 16 07/17/2023   CO2 27 07/17/2023   TSH 0.58 07/17/2023   PSA 1.41 07/17/2023   HGBA1C 6.8 (H) 07/17/2023   MICROALBUR 7.4 (H) 01/06/2020    No results found.  Assessment & Plan:   Problem List Items Addressed This Visit     DM2 (diabetes mellitus, type 2) (HCC)     On Metformin, Prandin, Actos      Relevant Orders   Urine microalbumin-creatinine with uACR   Comprehensive metabolic panel   Lipid panel   Hemoglobin A1c   PSA   TSH   CBC with Differential/Platelet   Urinalysis   BPH (benign prostatic hyperplasia)   F/u w/Dr Retta Diones - PSA q 12 mo      Well adult exam - Primary   We discussed age appropriate health related issues, including available/recomended screening tests and vaccinations. We discussed a need for adhering to healthy diet and exercise. Labs were ordered to be later reviewed . All questions were answered. CT ca score test offered 1/20 Dr Retta Diones - PSA q 12 mo Colon is due 2026 Rectal - per Urology/GI      Chronic UTI   Monitor for sx's On abx now per Urology      Relevant Medications   tacrolimus (PROTOPIC) 0.1 % ointment   clotrimazole-betamethasone (LOTRISONE) lotion   Other Relevant Orders   Urinalysis   Elevated BP without diagnosis  of hypertension   BP Readings from Last 3 Encounters:  01/19/24 122/80  07/17/23 130/80  12/30/22 138/62         Dyslipidemia   On Omega-3      Relevant Orders   Urine microalbumin-creatinine with uACR   Comprehensive metabolic panel   Lipid panel  Hemoglobin A1c   PSA   TSH   CBC with Differential/Platelet   Urinalysis   Coronary atherosclerosis   Cont on aspirin and fish oil      Seborrheic dermatitis   Lotrisone lotion bid prn         Meds ordered this encounter  Medications   tacrolimus (PROTOPIC) 0.1 % ointment    Sig: Apply topically 2 (two) times daily.    Dispense:  60 g    Refill:  1   clotrimazole-betamethasone (LOTRISONE) lotion    Sig: Apply topically 2 (two) times daily.    Dispense:  30 mL    Refill:  1      Follow-up: Return in about 6 months (around 07/18/2024) for a follow-up visit.  Sonda Primes, MD

## 2024-01-19 NOTE — Assessment & Plan Note (Signed)
Lotrisone lotion bid prn

## 2024-01-19 NOTE — Assessment & Plan Note (Signed)
On Metformin, Prandin, Actos

## 2024-01-19 NOTE — Assessment & Plan Note (Signed)
Monitor for sx's On abx now per Urology

## 2024-01-19 NOTE — Assessment & Plan Note (Signed)
On Omega-3

## 2024-01-19 NOTE — Assessment & Plan Note (Signed)
F/u w/Dr Dahlstedt - PSA q 12 mo

## 2024-01-20 ENCOUNTER — Encounter: Payer: Self-pay | Admitting: Internal Medicine

## 2024-03-08 ENCOUNTER — Other Ambulatory Visit: Payer: Self-pay | Admitting: Internal Medicine

## 2024-06-07 ENCOUNTER — Other Ambulatory Visit: Payer: Self-pay | Admitting: Internal Medicine

## 2024-06-10 ENCOUNTER — Other Ambulatory Visit: Payer: Self-pay | Admitting: Internal Medicine

## 2024-06-10 NOTE — Telephone Encounter (Signed)
 Copied from CRM [id number]. Topic: Clinical - Medication Refill >> Jun 10, 2024  4:33 PM Leah C wrote: Medication: cyclobenzaprine  (FLEXERIL ) 5 MG tablet  Has the patient contacted their pharmacy? No. The patient stated that he has an outdated prescription and is not comfortable with taking old medicine.  (Agent: If no, request that the patient contact the pharmacy for the refill. If patient does not wish to contact the pharmacy document the reason why and proceed with request.) (Agent: If yes, when and what did the pharmacy advise?)  This is the patient's preferred pharmacy:  [facility] #[id number] [place], [place] - [address] AT [facility] OF [address] [address] [place] [address] Phone: [phone] Fax: [phone]  Is this the correct pharmacy for this prescription? Yes If no, delete pharmacy and type the correct one.   Has the prescription been filled recently? No  Is the patient out of the medication? Yes  Has the patient been seen for an appointment in the last year OR does the patient have an upcoming appointment? Yes  Can we respond through MyChart? Yes  Agent: Please be advised that Rx refills may take up to 3 business days. We ask that you follow-up with your pharmacy.

## 2024-06-17 MED ORDER — CYCLOBENZAPRINE HCL 5 MG PO TABS: 5.0000 mg | ORAL_TABLET | Freq: Two times a day (BID) | ORAL | 3 refills | Status: AC | PRN

## 2024-07-19 ENCOUNTER — Encounter: Payer: Self-pay | Admitting: Internal Medicine

## 2024-07-19 ENCOUNTER — Ambulatory Visit: Payer: Federal, State, Local not specified - PPO | Admitting: Internal Medicine

## 2024-07-19 VITALS — BP 140/88 | HR 82 | Temp 97.8°F | Ht 71.0 in | Wt 211.0 lb

## 2024-07-19 DIAGNOSIS — E785 Hyperlipidemia, unspecified: Secondary | ICD-10-CM | POA: Diagnosis not present

## 2024-07-19 DIAGNOSIS — N390 Urinary tract infection, site not specified: Secondary | ICD-10-CM

## 2024-07-19 DIAGNOSIS — E119 Type 2 diabetes mellitus without complications: Secondary | ICD-10-CM

## 2024-07-19 DIAGNOSIS — Z7984 Long term (current) use of oral hypoglycemic drugs: Secondary | ICD-10-CM | POA: Diagnosis not present

## 2024-07-19 LAB — URINALYSIS, ROUTINE W REFLEX MICROSCOPIC
Bilirubin Urine: NEGATIVE
Ketones, ur: NEGATIVE
Nitrite: POSITIVE — AB
Specific Gravity, Urine: 1.02 (ref 1.000–1.030)
Total Protein, Urine: NEGATIVE
Urine Glucose: NEGATIVE
Urobilinogen, UA: 0.2 (ref 0.0–1.0)
pH: 6 (ref 5.0–8.0)

## 2024-07-19 LAB — LIPID PANEL
Cholesterol: 187 mg/dL (ref 0–200)
HDL: 66 mg/dL (ref >39.00)
LDL Cholesterol: 109 mg/dL — ABNORMAL HIGH (ref 0–99)
NonHDL: 121.21
Total CHOL/HDL Ratio: 3
Triglycerides: 62 mg/dL (ref 0.0–149.0)
VLDL: 12.4 mg/dL (ref 0.0–40.0)

## 2024-07-19 LAB — COMPREHENSIVE METABOLIC PANEL WITH GFR
ALT: 18 U/L (ref 0–53)
AST: 20 U/L (ref 0–37)
Albumin: 4.5 g/dL (ref 3.5–5.2)
Alkaline Phosphatase: 45 U/L (ref 39–117)
BUN: 13 mg/dL (ref 6–23)
CO2: 24 meq/L (ref 19–32)
Calcium: 10.2 mg/dL (ref 8.4–10.5)
Chloride: 101 meq/L (ref 96–112)
Creatinine, Ser: 1.07 mg/dL (ref 0.40–1.50)
GFR: 69.29 mL/min (ref >60.00)
Glucose, Bld: 119 mg/dL — ABNORMAL HIGH (ref 70–99)
Potassium: 4 meq/L (ref 3.5–5.1)
Sodium: 136 meq/L (ref 135–145)
Total Bilirubin: 0.7 mg/dL (ref 0.2–1.2)
Total Protein: 7.8 g/dL (ref 6.0–8.3)

## 2024-07-19 LAB — TSH: TSH: 0.39 u[IU]/mL (ref 0.35–5.50)

## 2024-07-19 LAB — HEMOGLOBIN A1C: Hgb A1c MFr Bld: 7.2 % — ABNORMAL HIGH (ref 4.6–6.5)

## 2024-07-19 LAB — MICROALBUMIN / CREATININE URINE RATIO
Creatinine,U: 105.7 mg/dL
Microalb Creat Ratio: 21.2 mg/g (ref 0.0–30.0)
Microalb, Ur: 2.2 mg/dL — ABNORMAL HIGH (ref 0.0–1.9)

## 2024-07-19 NOTE — Assessment & Plan Note (Signed)
On Omega-3

## 2024-07-19 NOTE — Assessment & Plan Note (Signed)
On Metformin, Prandin, Actos

## 2024-07-19 NOTE — Progress Notes (Signed)
 Subjective:  Patient ID: [name], male    DOB: 07/04/1951  Age: 73 y.o. MRN: [id number]  CC: Medical Management of Chronic Issues (6 MNTH F/U )   HPI [name] presents for DM, abn UA, dyslipidemia  Outpatient Medications Prior to Visit  Medication Sig Dispense Refill   aspirin 81 MG EC tablet Take 81 mg by mouth daily.     Cholecalciferol (VITAMIN D ) 2000 units CAPS Take 2,000 Units by mouth daily.     clotrimazole -betamethasone  (LOTRISONE ) lotion Apply topically 2 (two) times daily. 30 mL 1   cyclobenzaprine  (FLEXERIL ) 5 MG tablet Take 1-2 tablets (5-10 mg total) by mouth 3 times/day as needed-between meals & bedtime for muscle spasms. 60 tablet 3   metFORMIN  (GLUCOPHAGE ) 1000 MG tablet TAKE 1 TABLET(1000 MG) BY MOUTH TWICE DAILY WITH A MEAL 180 tablet 3   Omega-3 Fatty Acids (FISH OIL) 1000 MG CAPS Take 1 capsule by mouth 2 (two) times daily.     pioglitazone  (ACTOS ) 15 MG tablet TAKE 1 TABLET(15 MG) BY MOUTH DAILY 90 tablet 3   repaglinide  (PRANDIN ) 2 MG tablet Take 1 tablet (2 mg total) by mouth 3 (three) times daily before meals. 270 tablet 3   tacrolimus  (PROTOPIC ) 0.1 % ointment Apply topically 2 (two) times daily. 60 g 1   triamcinolone  ointment (KENALOG ) 0.1 % Apply 1 Application topically 3 (three) times daily. 80 g 2   No facility-administered medications prior to visit.    ROS: Review of Systems  Constitutional:  Negative for appetite change, fatigue and unexpected weight change.  HENT:  Negative for congestion, nosebleeds, sneezing, sore throat and trouble swallowing.   Eyes:  Negative for itching and visual disturbance.  Respiratory:  Negative for cough.   Cardiovascular:  Negative for chest pain, palpitations and leg swelling.  Gastrointestinal:  Negative for abdominal distention, blood in stool, diarrhea and nausea.  Genitourinary:  Negative for frequency and hematuria.  Musculoskeletal:  Negative for back pain, gait problem, joint swelling and neck pain.   Skin:  Negative for rash.  Neurological:  Negative for dizziness, tremors, speech difficulty and weakness.  Psychiatric/Behavioral:  Negative for agitation, dysphoric mood and sleep disturbance. The patient is not nervous/anxious.     Objective:  BP (!) 140/88   Pulse 82   Temp 97.8 F (36.6 C) (Oral)   Ht 5' 11 (1.803 m)   Wt 211 lb (95.7 kg)   SpO2 98%   BMI 29.43 kg/m   BP Readings from Last 3 Encounters:  07/19/24 (!) 140/88  01/19/24 122/80  07/17/23 130/80    Wt Readings from Last 3 Encounters:  07/19/24 211 lb (95.7 kg)  01/19/24 213 lb (96.6 kg)  07/17/23 211 lb (95.7 kg)    Physical Exam Constitutional:      General: He is not in acute distress.    Appearance: He is well-developed.     Comments: NAD  Eyes:     Conjunctiva/sclera: Conjunctivae normal.     Pupils: Pupils are equal, round, and reactive to light.  Neck:     Thyroid : No thyromegaly.     Vascular: No JVD.  Cardiovascular:     Rate and Rhythm: Normal rate and regular rhythm.     Heart sounds: Normal heart sounds. No murmur heard.    No friction rub. No gallop.  Pulmonary:     Effort: Pulmonary effort is normal. No respiratory distress.     Breath sounds: Normal breath sounds. No wheezing or rales.  Chest:  Chest wall: No tenderness.  Abdominal:     General: Bowel sounds are normal. There is no distension.     Palpations: Abdomen is soft. There is no mass.     Tenderness: There is no abdominal tenderness. There is no guarding or rebound.  Musculoskeletal:        General: No tenderness. Normal range of motion.     Cervical back: Normal range of motion.     Right lower leg: No edema.     Left lower leg: No edema.  Lymphadenopathy:     Cervical: No cervical adenopathy.  Skin:    General: Skin is warm and dry.     Findings: No rash.  Neurological:     Mental Status: He is alert and oriented to person, place, and time.     Cranial Nerves: No cranial nerve deficit.     Motor: No  abnormal muscle tone.     Coordination: Coordination normal.     Gait: Gait normal.     Deep Tendon Reflexes: Reflexes are normal and symmetric.  Psychiatric:        Behavior: Behavior normal.        Thought Content: Thought content normal.        Judgment: Judgment normal.     Lab Results  Component Value Date   WBC 6.8 01/19/2024   HGB 15.9 01/19/2024   HCT 47.9 01/19/2024   PLT 208.0 01/19/2024   GLUCOSE 122 (H) 01/19/2024   CHOL 192 01/19/2024   TRIG 72.0 01/19/2024   HDL 61.80 01/19/2024   LDLDIRECT 116.0 01/06/2020   LDLCALC 116 (H) 01/19/2024   ALT 18 01/19/2024   AST 17 01/19/2024   NA 138 01/19/2024   K 3.8 01/19/2024   CL 104 01/19/2024   CREATININE 1.01 01/19/2024   BUN 12 01/19/2024   CO2 25 01/19/2024   TSH 1.22 01/19/2024   PSA 0.95 01/19/2024   HGBA1C 7.2 (H) 01/19/2024    No results found.  Assessment & Plan:   Problem List Items Addressed This Visit     Chronic UTI   Monitor for sx's On abx now per Urology      Relevant Orders   Urinalysis   DM2 (diabetes mellitus, type 2) (HCC) - Primary     On Metformin , Prandin , Actos       Relevant Orders   Comprehensive metabolic panel with GFR   Hemoglobin A1c   Lipid panel   TSH   Microalbumin / creatinine urine ratio   Urinalysis   Dyslipidemia   On Omega-3      Relevant Orders   Lipid panel   TSH      No orders of the defined types were placed in this encounter.     Follow-up: Return in about 6 months (around 01/19/2025) for Wellness Exam.  Marolyn Noel, MD

## 2024-07-19 NOTE — Assessment & Plan Note (Signed)
Monitor for sx's On abx now per Urology

## 2024-07-20 ENCOUNTER — Ambulatory Visit: Payer: Self-pay | Admitting: Internal Medicine

## 2024-09-02 ENCOUNTER — Other Ambulatory Visit: Payer: Self-pay | Admitting: Internal Medicine

## 2024-09-02 DIAGNOSIS — E119 Type 2 diabetes mellitus without complications: Secondary | ICD-10-CM

## 2024-11-30 ENCOUNTER — Other Ambulatory Visit: Payer: Self-pay | Admitting: Internal Medicine

## 2025-01-17 ENCOUNTER — Ambulatory Visit: Admitting: Internal Medicine

## 2025-02-28 ENCOUNTER — Ambulatory Visit: Admitting: Internal Medicine
